# Patient Record
Sex: Female | Born: 1965 | Marital: Married | Smoking: Never smoker
Health system: Southern US, Community
[De-identification: ages and names within clinical notes are randomized; demographics above are authoritative.]

## PROBLEM LIST (undated history)

## (undated) DIAGNOSIS — C419 Malignant neoplasm of bone and articular cartilage, unspecified: Secondary | ICD-10-CM

## (undated) DIAGNOSIS — R001 Bradycardia, unspecified: Secondary | ICD-10-CM

## (undated) DIAGNOSIS — R569 Unspecified convulsions: Secondary | ICD-10-CM

## (undated) HISTORY — PX: PORTACATH PLACEMENT: SHX2246

## (undated) HISTORY — DX: Bradycardia, unspecified: R00.1

## (undated) HISTORY — PX: OTHER SURGICAL HISTORY: SHX169

## (undated) HISTORY — PX: LUNG CANCER SURGERY: SHX702

## (undated) HISTORY — DX: Unspecified convulsions: R56.9

## (undated) HISTORY — DX: Malignant neoplasm of bone and articular cartilage, unspecified: C41.9

## (undated) HISTORY — PX: BRAIN SURGERY: SHX531

---

## 2003-06-15 ENCOUNTER — Other Ambulatory Visit: Payer: Self-pay

## 2004-01-12 ENCOUNTER — Ambulatory Visit: Payer: Self-pay

## 2004-12-25 ENCOUNTER — Ambulatory Visit: Payer: Self-pay | Admitting: Unknown Physician Specialty

## 2006-02-05 ENCOUNTER — Ambulatory Visit: Payer: Self-pay

## 2007-06-25 ENCOUNTER — Ambulatory Visit: Payer: Self-pay

## 2008-04-08 HISTORY — PX: GALLBLADDER SURGERY: SHX652

## 2008-06-28 ENCOUNTER — Ambulatory Visit: Payer: Self-pay

## 2009-01-30 ENCOUNTER — Ambulatory Visit: Payer: Self-pay | Admitting: Family Medicine

## 2009-02-02 ENCOUNTER — Ambulatory Visit: Payer: Self-pay | Admitting: Surgery

## 2009-02-06 ENCOUNTER — Ambulatory Visit: Payer: Self-pay | Admitting: Cardiothoracic Surgery

## 2009-02-14 ENCOUNTER — Ambulatory Visit: Payer: Self-pay | Admitting: Surgery

## 2009-02-20 ENCOUNTER — Ambulatory Visit: Payer: Self-pay | Admitting: Specialist

## 2009-02-22 ENCOUNTER — Ambulatory Visit: Payer: Self-pay | Admitting: Cardiothoracic Surgery

## 2009-02-23 ENCOUNTER — Ambulatory Visit: Payer: Self-pay | Admitting: Specialist

## 2009-03-03 ENCOUNTER — Ambulatory Visit: Payer: Self-pay | Admitting: General Surgery

## 2009-03-08 ENCOUNTER — Inpatient Hospital Stay: Payer: Self-pay | Admitting: General Surgery

## 2009-03-08 ENCOUNTER — Ambulatory Visit: Payer: Self-pay | Admitting: Cardiothoracic Surgery

## 2009-03-17 ENCOUNTER — Ambulatory Visit: Payer: Self-pay | Admitting: General Surgery

## 2009-03-21 ENCOUNTER — Ambulatory Visit: Payer: Self-pay | Admitting: General Surgery

## 2009-03-22 ENCOUNTER — Ambulatory Visit: Payer: Self-pay | Admitting: Internal Medicine

## 2009-04-08 ENCOUNTER — Ambulatory Visit: Payer: Self-pay | Admitting: Cardiothoracic Surgery

## 2009-04-10 ENCOUNTER — Ambulatory Visit: Payer: Self-pay | Admitting: Internal Medicine

## 2009-04-10 ENCOUNTER — Ambulatory Visit: Payer: Self-pay | Admitting: General Surgery

## 2009-04-11 ENCOUNTER — Ambulatory Visit: Payer: Self-pay | Admitting: General Surgery

## 2009-05-02 ENCOUNTER — Ambulatory Visit: Payer: Self-pay | Admitting: General Surgery

## 2009-05-09 ENCOUNTER — Ambulatory Visit: Payer: Self-pay | Admitting: Cardiothoracic Surgery

## 2009-05-09 ENCOUNTER — Ambulatory Visit: Payer: Self-pay | Admitting: Internal Medicine

## 2009-06-06 ENCOUNTER — Ambulatory Visit: Payer: Self-pay | Admitting: Cardiothoracic Surgery

## 2009-06-06 ENCOUNTER — Ambulatory Visit: Payer: Self-pay | Admitting: Internal Medicine

## 2009-07-04 ENCOUNTER — Ambulatory Visit: Payer: Self-pay | Admitting: Internal Medicine

## 2009-07-07 ENCOUNTER — Ambulatory Visit: Payer: Self-pay | Admitting: Cardiothoracic Surgery

## 2009-07-07 ENCOUNTER — Ambulatory Visit: Payer: Self-pay | Admitting: Internal Medicine

## 2009-08-06 ENCOUNTER — Ambulatory Visit: Payer: Self-pay | Admitting: Internal Medicine

## 2009-08-06 ENCOUNTER — Ambulatory Visit: Payer: Self-pay | Admitting: Cardiothoracic Surgery

## 2009-09-06 ENCOUNTER — Ambulatory Visit: Payer: Self-pay | Admitting: Cardiothoracic Surgery

## 2009-09-06 ENCOUNTER — Ambulatory Visit: Payer: Self-pay | Admitting: Internal Medicine

## 2009-10-03 ENCOUNTER — Ambulatory Visit: Payer: Self-pay | Admitting: General Surgery

## 2009-10-06 ENCOUNTER — Ambulatory Visit: Payer: Self-pay | Admitting: Internal Medicine

## 2009-10-06 ENCOUNTER — Ambulatory Visit: Payer: Self-pay | Admitting: Cardiothoracic Surgery

## 2009-11-06 ENCOUNTER — Ambulatory Visit: Payer: Self-pay | Admitting: Cardiothoracic Surgery

## 2009-11-06 ENCOUNTER — Ambulatory Visit: Payer: Self-pay | Admitting: Internal Medicine

## 2009-12-05 ENCOUNTER — Emergency Department: Payer: Self-pay | Admitting: Emergency Medicine

## 2009-12-07 ENCOUNTER — Ambulatory Visit: Payer: Self-pay | Admitting: Cardiothoracic Surgery

## 2009-12-07 ENCOUNTER — Ambulatory Visit: Payer: Self-pay | Admitting: Internal Medicine

## 2010-01-22 ENCOUNTER — Ambulatory Visit: Payer: Self-pay | Admitting: Internal Medicine

## 2010-02-06 ENCOUNTER — Ambulatory Visit: Payer: Self-pay | Admitting: Internal Medicine

## 2010-03-08 ENCOUNTER — Ambulatory Visit: Payer: Self-pay | Admitting: Internal Medicine

## 2010-04-08 ENCOUNTER — Ambulatory Visit: Payer: Self-pay | Admitting: Internal Medicine

## 2010-04-20 ENCOUNTER — Observation Stay: Payer: Self-pay | Admitting: Internal Medicine

## 2010-05-09 ENCOUNTER — Ambulatory Visit: Payer: Self-pay | Admitting: Internal Medicine

## 2010-06-07 ENCOUNTER — Ambulatory Visit: Payer: Self-pay | Admitting: Internal Medicine

## 2010-07-10 ENCOUNTER — Ambulatory Visit: Payer: Self-pay | Admitting: Internal Medicine

## 2010-08-07 ENCOUNTER — Ambulatory Visit: Payer: Self-pay | Admitting: Internal Medicine

## 2010-10-03 ENCOUNTER — Ambulatory Visit: Payer: Self-pay | Admitting: Internal Medicine

## 2010-10-07 ENCOUNTER — Ambulatory Visit: Payer: Self-pay | Admitting: Internal Medicine

## 2010-10-11 ENCOUNTER — Emergency Department: Payer: Self-pay | Admitting: Emergency Medicine

## 2010-11-05 ENCOUNTER — Inpatient Hospital Stay: Payer: Self-pay | Admitting: Internal Medicine

## 2010-11-07 ENCOUNTER — Ambulatory Visit: Payer: Self-pay | Admitting: Internal Medicine

## 2010-12-06 ENCOUNTER — Inpatient Hospital Stay: Payer: Self-pay | Admitting: Internal Medicine

## 2010-12-08 ENCOUNTER — Ambulatory Visit: Payer: Self-pay | Admitting: Internal Medicine

## 2010-12-11 ENCOUNTER — Emergency Department: Payer: Self-pay | Admitting: Emergency Medicine

## 2011-01-07 ENCOUNTER — Ambulatory Visit: Payer: Self-pay | Admitting: Internal Medicine

## 2011-01-17 ENCOUNTER — Inpatient Hospital Stay: Payer: Self-pay | Admitting: Internal Medicine

## 2011-02-07 ENCOUNTER — Ambulatory Visit: Payer: Self-pay | Admitting: Internal Medicine

## 2011-03-09 ENCOUNTER — Ambulatory Visit: Payer: Self-pay | Admitting: Internal Medicine

## 2011-04-09 ENCOUNTER — Ambulatory Visit: Payer: Self-pay | Admitting: Internal Medicine

## 2011-04-10 LAB — CBC CANCER CENTER
Basophil %: 0 %
Eosinophil %: 0.8 %
HCT: 29.8 % — ABNORMAL LOW (ref 35.0–47.0)
HGB: 10 g/dL — ABNORMAL LOW (ref 12.0–16.0)
Lymphocyte #: 0.4 x10 3/mm — ABNORMAL LOW (ref 1.0–3.6)
MCH: 31.3 pg (ref 26.0–34.0)
MCHC: 33.6 g/dL (ref 32.0–36.0)
MCV: 93 fL (ref 80–100)
Monocyte #: 0.1 x10 3/mm (ref 0.0–0.7)
Monocyte %: 4.5 %
Neutrophil #: 1.6 x10 3/mm (ref 1.4–6.5)
Neutrophil %: 75.1 %
RBC: 3.19 10*6/uL — ABNORMAL LOW (ref 3.80–5.20)

## 2011-04-17 LAB — CBC CANCER CENTER
Basophil %: 0.2 %
Lymphocyte #: 0.7 x10 3/mm — ABNORMAL LOW (ref 1.0–3.6)
Lymphocyte %: 19.5 %
MCH: 31.5 pg (ref 26.0–34.0)
MCHC: 33.9 g/dL (ref 32.0–36.0)
Monocyte %: 3 %
Neutrophil #: 2.8 x10 3/mm (ref 1.4–6.5)
RDW: 18.5 % — ABNORMAL HIGH (ref 11.5–14.5)

## 2011-04-17 LAB — HEPATIC FUNCTION PANEL A (ARMC)
Albumin: 3.6 g/dL (ref 3.4–5.0)
Bilirubin, Direct: 0.2 mg/dL (ref 0.00–0.20)
SGPT (ALT): 31 U/L

## 2011-04-17 LAB — BASIC METABOLIC PANEL
BUN: 10 mg/dL (ref 7–18)
Chloride: 101 mmol/L (ref 98–107)
Co2: 30 mmol/L (ref 21–32)
Creatinine: 0.86 mg/dL (ref 0.60–1.30)
EGFR (African American): >60
Sodium: 138 mmol/L (ref 136–145)

## 2011-04-24 LAB — CBC CANCER CENTER
Basophil #: 0 x10 3/mm (ref 0.0–0.1)
Eosinophil #: 0 x10 3/mm (ref 0.0–0.7)
HCT: 37.2 % (ref 35.0–47.0)
Lymphocyte #: 0.8 x10 3/mm — ABNORMAL LOW (ref 1.0–3.6)
Lymphocyte %: 25.4 %
MCHC: 33.6 g/dL (ref 32.0–36.0)
Monocyte #: 0.2 x10 3/mm (ref 0.0–0.7)
Neutrophil #: 2 x10 3/mm (ref 1.4–6.5)
Platelet: 32 x10 3/mm — ABNORMAL LOW (ref 150–440)
RDW: 21 % — ABNORMAL HIGH (ref 11.5–14.5)

## 2011-05-02 LAB — CBC CANCER CENTER
Eosinophil #: 0 x10 3/mm (ref 0.0–0.7)
Eosinophil %: 0.6 %
HCT: 36.4 % (ref 35.0–47.0)
Lymphocyte #: 0.8 x10 3/mm — ABNORMAL LOW (ref 1.0–3.6)
MCH: 33.4 pg (ref 26.0–34.0)
MCHC: 33.9 g/dL (ref 32.0–36.0)
MCV: 98 fL (ref 80–100)
Monocyte #: 0.1 x10 3/mm (ref 0.0–0.7)
Neutrophil %: 60.5 %
Platelet: 24 x10 3/mm — CL (ref 150–440)
RBC: 3.7 10*6/uL — ABNORMAL LOW (ref 3.80–5.20)
WBC: 2.6 x10 3/mm — ABNORMAL LOW (ref 3.6–11.0)

## 2011-05-09 ENCOUNTER — Encounter: Payer: Self-pay | Admitting: Internal Medicine

## 2011-05-09 DIAGNOSIS — R9431 Abnormal electrocardiogram [ECG] [EKG]: Secondary | ICD-10-CM

## 2011-05-09 LAB — COMPREHENSIVE METABOLIC PANEL
Anion Gap: 8 (ref 7–16)
Bilirubin,Total: 0.9 mg/dL (ref 0.2–1.0)
Chloride: 102 mmol/L (ref 98–107)
EGFR (African American): >60
Glucose: 105 mg/dL — ABNORMAL HIGH (ref 65–99)
Osmolality: 278 (ref 275–301)
Potassium: 3.6 mmol/L (ref 3.5–5.1)
SGOT(AST): 53 U/L — ABNORMAL HIGH (ref 15–37)
Sodium: 140 mmol/L (ref 136–145)
Total Protein: 7.4 g/dL (ref 6.4–8.2)

## 2011-05-09 LAB — CBC CANCER CENTER
Basophil #: 0 x10 3/mm (ref 0.0–0.1)
Eosinophil #: 0 x10 3/mm (ref 0.0–0.7)
Eosinophil %: 1.4 %
HCT: 35.4 % (ref 35.0–47.0)
Lymphocyte #: 0.5 x10 3/mm — ABNORMAL LOW (ref 1.0–3.6)
Lymphocyte %: 25.2 %
MCH: 34.1 pg — ABNORMAL HIGH (ref 26.0–34.0)
MCHC: 34.3 g/dL (ref 32.0–36.0)
MCV: 99 fL (ref 80–100)
Monocyte %: 6 %
Neutrophil %: 67.3 %
Platelet: 26 x10 3/mm — CL (ref 150–440)
RBC: 3.56 10*6/uL — ABNORMAL LOW (ref 3.80–5.20)
RDW: 23.4 % — ABNORMAL HIGH (ref 11.5–14.5)

## 2011-05-09 LAB — MAGNESIUM: Magnesium: 1.9 mg/dL

## 2011-05-09 LAB — PROTIME-INR: Prothrombin Time: 13.1 secs (ref 11.5–14.7)

## 2011-05-10 ENCOUNTER — Encounter: Payer: Self-pay | Admitting: Internal Medicine

## 2011-05-10 ENCOUNTER — Ambulatory Visit: Payer: Self-pay | Admitting: Internal Medicine

## 2011-05-10 ENCOUNTER — Ambulatory Visit (INDEPENDENT_AMBULATORY_CARE_PROVIDER_SITE_OTHER): Payer: BC Managed Care – PPO | Admitting: Internal Medicine

## 2011-05-10 VITALS — BP 151/85 | HR 85 | Ht 61.0 in | Wt 128.0 lb

## 2011-05-10 DIAGNOSIS — R9431 Abnormal electrocardiogram [ECG] [EKG]: Secondary | ICD-10-CM

## 2011-05-10 DIAGNOSIS — I498 Other specified cardiac arrhythmias: Secondary | ICD-10-CM

## 2011-05-10 DIAGNOSIS — R001 Bradycardia, unspecified: Secondary | ICD-10-CM

## 2011-05-10 NOTE — Progress Notes (Signed)
History and Physical  Patient ID: [name] MRN: [id number], SOB: 1965/09/05 46 y.o. Date of Encounter: 05/10/2011, 9:12 AM  Primary Physician: No primary provider on file. Primary Cardiologist:  Primary Electrophysiologist:  Graciela Husbands  Chief Complaint: referred for bradycardia History of Present Illness: Ashley Flowers is a 46 y.o. 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 We unfortunately don't have that tracing. She was having no associated symptoms with this.  Concurrent with this medication has also been episodic nausea and vomiting which was the one occasion with protracted and associated with diarrhea dehydration    She continues to have progressively metastatic disease. Evaluation of her heart in the past included extensive testing in [place] the fall 2011 which is normal.   Past Medical History  Diagnosis Date  . Ewing sarcoma     Pnet sarcoma  . Seizures      Past Surgical History  Procedure Date  . Gallbladder surgery 2010  . Lung cancer surgery   . Brain surgery     brain tumor  . Kidney stones   . Portacath placement     left side      Current Outpatient Prescriptions  Medication Sig Dispense Refill  . ALPRAZolam (XANAX) 0.5 MG tablet Take 0.5 mg by mouth at bedtime as needed.      . lansoprazole (PREVACID) 15 MG capsule Take 15 mg by mouth daily.      Marland Kitchen levETIRAcetam (KEPPRA) 500 MG tablet Take 1,000 mg by mouth every 12 (twelve) hours.      Marland Kitchen oxycodone (OXY-IR) 5 MG capsule Take 5 mg by mouth every 4 (four) hours as needed.      . promethazine (PHENERGAN) 25 MG tablet Take 25 mg by mouth every 6 (six) hours as needed.      . zolpidem (AMBIEN) 10 MG tablet Take 10 mg  by mouth at bedtime as needed.         Allergies: Allergies  Allergen Reactions  . Erythromycin   . Flagyl (Metronidazole Hcl)     Could cause seizures  . Sulfa Antibiotics   . Vancomycin   . Zofran      History  Substance Use Topics  . Smoking status: Never Smoker   . Smokeless tobacco: Not on file  . Alcohol Use: No      History reviewed. No pertinent family history.    ROS:  Please see the history of present illness.     All other systems reviewed and negative.   Vital Signs: Blood pressure 151/85, pulse 85, height 5\' 1"  (1.549 m), weight 128 lb (58.06 kg).  PHYSICAL EXAM: General:  Well nourished, il appearing caucasian female in no acute distress but pale and upset  HEENT: normal Lymph: no adenopathy Neck: no JVD Endocrine:  No thryomegaly Vascular: No carotid bruits; FA pulses 2+ bilaterally without bruits Cardiac:  normal S1, S2; RRR; 2/6 murmur Back: without kyphosis/scoliosis, no CVA tenderness Lungs:  clear to auscultation bilaterally,decreased breath sounds rightAbd: soft, nontender, no hepatomegaly Ext: no edema Musculoskeletal:  No deformities, BUE and BLE strength normal and equal Skin: warm and dry Neuro:  CNs 2-12 intact, no focal abnormalities noted Psych:  Normal affect   EKG:  nsr 86 .  12/.08/.38 O/w normal  Labs:   No results found for this basename: WBC, HGB, HCT, MCV, PLT   No results found for this basename: NA,K,CL,CO2,BUN,CREATININE,CALCIUM,LABALBU,PROT,BILITOT,ALKPHOS,ALT,AST,GLUCOSE in the last 168 hours No results found for this basename: CKTOTAL:4,CKMB:4,TROPONINI:4 in the last 72 hours No results found for this basename: CHOL, HDL, LDLCALC, TRIG   No results found for this basename: DDIMER   BNP No results found for this basename: probnp       ASSESSMENT AND PLAN:

## 2011-05-10 NOTE — Assessment & Plan Note (Addendum)
The patient has reportedly junctional rhythm and bradycardia on ECG yesterday. UCG today is normal. Comparative feel echocardiogram in December it is unchanged except for the heart rate is about 15 beats per minutes slower.  The issue on the table is whether the VOTRIENT is responsible for the bradycardia and whether the bradycardia is responsible for nausea and vomiting. I think the likely answer to the lateral question is no. The answer to the former question is probably yes. I have found case reports of bradycardia associated with this drug.  It's palliative value in this patient seems to be high. In addition she expressed great sadness and the potential that she would not be able to take the medication as it would then portend nothing left to do and her impending dying. In the event that it is felt important to try to continue the VOTRIENT I would suggest that we used a MCOT monitor which would allow [place]  real-time telemetry. Initially I would hold the medication for a week and get a baseline and then resume the medication and if bradycardia that was symptomatic developed in the medication could be stopped. In the event that the medication needed to be continued, and MRI compatible pacemaker might be an option. I will discuss these issues with Dr.  Carmelina Noun   LATE addition   The strips finally arrived this afternoon(or so) and showed  A >3-4 sec pause.  This is much more concering than i had inferred from the label of her problem  She is being monitored by the [facility] and the plan is to keep her off the VOTRIENT for a week before resuming so e will be able to define a baseline as well as identify any further pauses

## 2011-05-15 LAB — CBC CANCER CENTER
Basophil #: 0 x10 3/mm (ref 0.0–0.1)
Basophil %: 0.1 %
Eosinophil #: 0.1 x10 3/mm (ref 0.0–0.7)
Eosinophil %: 1.4 %
HCT: 36.3 % (ref 35.0–47.0)
HGB: 12.4 g/dL (ref 12.0–16.0)
Lymphocyte #: 0.7 x10 3/mm — ABNORMAL LOW (ref 1.0–3.6)
MCV: 100 fL (ref 80–100)
Monocyte #: 0.3 x10 3/mm (ref 0.0–0.7)
Monocyte %: 9.1 %
Neutrophil #: 2.7 x10 3/mm (ref 1.4–6.5)
Neutrophil %: 70 %
Platelet: 39 x10 3/mm — ABNORMAL LOW (ref 150–440)
RDW: 23.5 % — ABNORMAL HIGH (ref 11.5–14.5)
WBC: 3.8 x10 3/mm (ref 3.6–11.0)

## 2011-05-15 LAB — BASIC METABOLIC PANEL
Anion Gap: 7 (ref 7–16)
Calcium, Total: 9 mg/dL (ref 8.5–10.1)
Chloride: 103 mmol/L (ref 98–107)
Co2: 31 mmol/L (ref 21–32)
Creatinine: 0.76 mg/dL (ref 0.60–1.30)
EGFR (African American): >60
Sodium: 141 mmol/L (ref 136–145)

## 2011-05-22 LAB — CBC CANCER CENTER
Basophil %: 0.4 %
Eosinophil #: 0 x10 3/mm (ref 0.0–0.7)
Eosinophil %: 1.4 %
HCT: 34.5 % — ABNORMAL LOW (ref 35.0–47.0)
HGB: 11.6 g/dL — ABNORMAL LOW (ref 12.0–16.0)
Lymphocyte #: 0.6 x10 3/mm — ABNORMAL LOW (ref 1.0–3.6)
Lymphocyte %: 27.2 %
MCH: 33.9 pg (ref 26.0–34.0)
MCV: 101 fL — ABNORMAL HIGH (ref 80–100)
Monocyte %: 11.7 %
Neutrophil #: 1.3 x10 3/mm — ABNORMAL LOW (ref 1.4–6.5)
RBC: 3.43 10*6/uL — ABNORMAL LOW (ref 3.80–5.20)
WBC: 2.2 x10 3/mm — ABNORMAL LOW (ref 3.6–11.0)

## 2011-05-22 LAB — BASIC METABOLIC PANEL
Anion Gap: 8 (ref 7–16)
BUN: 13 mg/dL (ref 7–18)
Creatinine: 0.8 mg/dL (ref 0.60–1.30)
EGFR (African American): >60
EGFR (Non-African Amer.): >60
Glucose: 95 mg/dL (ref 65–99)
Osmolality: 279 (ref 275–301)

## 2011-05-22 LAB — MAGNESIUM: Magnesium: 2 mg/dL

## 2011-05-29 LAB — CBC CANCER CENTER
Basophil %: 0.2 %
Eosinophil #: 0 x10 3/mm (ref 0.0–0.7)
Eosinophil %: 1.1 %
HGB: 12.3 g/dL (ref 12.0–16.0)
Lymphocyte %: 27.4 %
MCH: 34.5 pg — ABNORMAL HIGH (ref 26.0–34.0)
Monocyte #: 0.2 x10 3/mm (ref 0.0–0.7)
Monocyte %: 6 %
Neutrophil %: 65.3 %
Platelet: 40 x10 3/mm — ABNORMAL LOW (ref 150–440)
RBC: 3.58 10*6/uL — ABNORMAL LOW (ref 3.80–5.20)
RDW: 21.7 % — ABNORMAL HIGH (ref 11.5–14.5)
WBC: 2.8 x10 3/mm — ABNORMAL LOW (ref 3.6–11.0)

## 2011-05-29 LAB — BASIC METABOLIC PANEL
BUN: 13 mg/dL (ref 7–18)
EGFR (African American): >60
EGFR (Non-African Amer.): >60
Glucose: 105 mg/dL — ABNORMAL HIGH (ref 65–99)
Osmolality: 282 (ref 275–301)
Potassium: 3.6 mmol/L (ref 3.5–5.1)

## 2011-06-05 LAB — CBC CANCER CENTER
Basophil %: 0 %
Eosinophil #: 0 x10 3/mm (ref 0.0–0.7)
HCT: 35.7 % (ref 35.0–47.0)
HGB: 12.3 g/dL (ref 12.0–16.0)
Lymphocyte %: 18.7 %
MCH: 34.6 pg — ABNORMAL HIGH (ref 26.0–34.0)
MCHC: 34.3 g/dL (ref 32.0–36.0)
MCV: 101 fL — ABNORMAL HIGH (ref 80–100)
Monocyte #: 0.2 x10 3/mm (ref 0.0–0.7)
Neutrophil %: 70.4 %
Platelet: 26 x10 3/mm — CL (ref 150–440)
RBC: 3.54 10*6/uL — ABNORMAL LOW (ref 3.80–5.20)

## 2011-06-06 ENCOUNTER — Telehealth: Payer: Self-pay | Admitting: Physician Assistant

## 2011-06-06 NOTE — Telephone Encounter (Signed)
Was called regarding abnormal rhythm recorded on patient's MCOT. Representative states the strip is of poor quality, but resembles atrial fibrillation. Rate 90-100 bpm. No pauses noted. The strip was faxed to the [facility] and [employer]. Upon calling patient, she states that she does not feel out of the ordinary. She reports that she has had been diagnosed with a "cold" and was put on Levaquin recently. She is currently on a trial off Vortient TKI to evaluate for bradycardic changes while on it. Upon speaking with the patient, and explaining the possibility of atrial fibrillation and complications of it, patient will present to the ED for a formal EKG.    Jacqulyn Bath, PA-C 06/06/2011 10:26 PM

## 2011-06-07 ENCOUNTER — Ambulatory Visit: Payer: Self-pay | Admitting: Internal Medicine

## 2011-06-07 ENCOUNTER — Observation Stay: Payer: Self-pay | Admitting: Internal Medicine

## 2011-06-07 DIAGNOSIS — I499 Cardiac arrhythmia, unspecified: Secondary | ICD-10-CM

## 2011-06-07 LAB — COMPREHENSIVE METABOLIC PANEL
Alkaline Phosphatase: 120 U/L (ref 50–136)
Anion Gap: 8 (ref 7–16)
BUN: 9 mg/dL (ref 7–18)
Calcium, Total: 8.8 mg/dL (ref 8.5–10.1)
Chloride: 101 mmol/L (ref 98–107)
Co2: 28 mmol/L (ref 21–32)
EGFR (African American): >60
EGFR (Non-African Amer.): >60
Glucose: 98 mg/dL (ref 65–99)
Osmolality: 272 (ref 275–301)
Sodium: 137 mmol/L (ref 136–145)
Total Protein: 6.8 g/dL (ref 6.4–8.2)

## 2011-06-07 LAB — DIFFERENTIAL
Basophil #: 0 10*3/uL (ref 0.0–0.1)
Eosinophil #: 0 10*3/uL (ref 0.0–0.7)
Eosinophil %: 0.2 %
Lymphocyte #: 0.4 10*3/uL — ABNORMAL LOW (ref 1.0–3.6)
Monocyte #: 0.2 10*3/uL (ref 0.0–0.7)
Neutrophil #: 1.7 10*3/uL (ref 1.4–6.5)

## 2011-06-07 LAB — CBC
HCT: 33.3 % — ABNORMAL LOW (ref 35.0–47.0)
HGB: 11.1 g/dL — ABNORMAL LOW (ref 12.0–16.0)
MCV: 102 fL — ABNORMAL HIGH (ref 80–100)
RBC: 3.27 10*6/uL — ABNORMAL LOW (ref 3.80–5.20)
RDW: 19.8 % — ABNORMAL HIGH (ref 11.5–14.5)
WBC: 2.3 10*3/uL — ABNORMAL LOW (ref 3.6–11.0)

## 2011-06-07 LAB — MAGNESIUM: Magnesium: 1.7 mg/dL — ABNORMAL LOW

## 2011-06-07 LAB — CK TOTAL AND CKMB (NOT AT ARMC): CK-MB: <0.5 ng/mL — ABNORMAL LOW (ref 0.5–3.6)

## 2011-06-07 LAB — URINALYSIS, COMPLETE
Bilirubin,UR: NEGATIVE
Glucose,UR: NEGATIVE mg/dL (ref 0–75)
Ph: 6 (ref 4.5–8.0)
RBC,UR: 32 /HPF (ref 0–5)
Specific Gravity: 1.02 (ref 1.003–1.030)
Squamous Epithelial: 1
WBC UR: 8 /HPF (ref 0–5)

## 2011-06-07 LAB — TROPONIN I: Troponin-I: <0.02 ng/mL

## 2011-06-12 LAB — CULTURE, BLOOD (SINGLE)

## 2011-06-13 LAB — CBC CANCER CENTER
Basophil %: 0.2 %
Eosinophil %: 1.6 %
Lymphocyte #: 0.4 x10 3/mm — ABNORMAL LOW (ref 1.0–3.6)
MCH: 35.2 pg — ABNORMAL HIGH (ref 26.0–34.0)
MCV: 104.7 fL — ABNORMAL HIGH (ref 80–100)
Monocyte #: 0.2 x10 3/mm (ref 0.0–0.7)
Platelet: 37 x10 3/mm — ABNORMAL LOW (ref 150–440)
RDW: 18.3 % — ABNORMAL HIGH (ref 11.5–14.5)

## 2011-06-14 ENCOUNTER — Other Ambulatory Visit: Payer: Self-pay

## 2011-06-14 DIAGNOSIS — R001 Bradycardia, unspecified: Secondary | ICD-10-CM

## 2011-06-19 LAB — BASIC METABOLIC PANEL
Calcium, Total: 8.8 mg/dL (ref 8.5–10.1)
Co2: 29 mmol/L (ref 21–32)
Creatinine: 0.75 mg/dL (ref 0.60–1.30)
EGFR (African American): >60
EGFR (Non-African Amer.): >60
Osmolality: 284 (ref 275–301)
Potassium: 3.6 mmol/L (ref 3.5–5.1)
Sodium: 143 mmol/L (ref 136–145)

## 2011-06-19 LAB — CBC CANCER CENTER
Basophil %: 0 %
Eosinophil %: 0.5 %
HCT: 33 % — ABNORMAL LOW (ref 35.0–47.0)
HGB: 11.1 g/dL — ABNORMAL LOW (ref 12.0–16.0)
Lymphocyte #: 0.3 x10 3/mm — ABNORMAL LOW (ref 1.0–3.6)
MCH: 34.4 pg — ABNORMAL HIGH (ref 26.0–34.0)
MCHC: 33.7 g/dL (ref 32.0–36.0)
MCV: 102 fL — ABNORMAL HIGH (ref 80–100)
Monocyte #: 0.1 x10 3/mm (ref 0.0–0.7)
Monocyte %: 6.2 %
Neutrophil #: 1.5 x10 3/mm (ref 1.4–6.5)
RBC: 3.23 10*6/uL — ABNORMAL LOW (ref 3.80–5.20)
WBC: 1.9 x10 3/mm — CL (ref 3.6–11.0)

## 2011-06-19 LAB — HEPATIC FUNCTION PANEL A (ARMC): SGOT(AST): 31 U/L (ref 15–37)

## 2011-06-20 LAB — URINALYSIS, COMPLETE
Bilirubin,UR: NEGATIVE
Glucose,UR: NEGATIVE mg/dL (ref 0–75)
Ketone: NEGATIVE
Leukocyte Esterase: NEGATIVE
RBC,UR: 741 /HPF (ref 0–5)
Squamous Epithelial: 1
WBC UR: 40 /HPF (ref 0–5)

## 2011-06-21 ENCOUNTER — Inpatient Hospital Stay: Payer: Self-pay | Admitting: Internal Medicine

## 2011-06-21 LAB — COMPREHENSIVE METABOLIC PANEL
Albumin: 3.7 g/dL (ref 3.4–5.0)
Alkaline Phosphatase: 117 U/L (ref 50–136)
Bilirubin,Total: 0.7 mg/dL (ref 0.2–1.0)
Calcium, Total: 9 mg/dL (ref 8.5–10.1)
Co2: 30 mmol/L (ref 21–32)
EGFR (Non-African Amer.): >60
Glucose: 82 mg/dL (ref 65–99)
Osmolality: 283 (ref 275–301)
Potassium: 4 mmol/L (ref 3.5–5.1)
SGOT(AST): 32 U/L (ref 15–37)
SGPT (ALT): 19 U/L

## 2011-06-21 LAB — CBC WITH DIFFERENTIAL/PLATELET
Basophil %: 0 %
Basophil %: 0.3 %
Comment - H1-Com1: NORMAL
Comment - H1-Com2: NORMAL
Eosinophil %: 0.7 %
Eosinophil: 2 %
HGB: 9.7 g/dL — ABNORMAL LOW (ref 12.0–16.0)
Lymphocyte #: 0.3 10*3/uL — ABNORMAL LOW (ref 1.0–3.6)
Lymphocyte #: 0.5 10*3/uL — ABNORMAL LOW (ref 1.0–3.6)
Lymphocyte %: 22.7 %
Lymphocytes: 24 %
MCH: 34.5 pg — ABNORMAL HIGH (ref 26.0–34.0)
MCH: 34.5 pg — ABNORMAL HIGH (ref 26.0–34.0)
MCHC: 33.4 g/dL (ref 32.0–36.0)
MCV: 103 fL — ABNORMAL HIGH (ref 80–100)
MCV: 104 fL — ABNORMAL HIGH (ref 80–100)
Monocyte %: 7.5 %
Monocyte %: 9 %
Neutrophil #: 1.6 10*3/uL (ref 1.4–6.5)
Neutrophil %: 74.9 %
Platelet: 23 10*3/uL — CL (ref 150–440)
Platelet: 47 10*3/uL — ABNORMAL LOW (ref 150–440)
RBC: 2.81 10*6/uL — ABNORMAL LOW (ref 3.80–5.20)
RBC: 3.05 10*6/uL — ABNORMAL LOW (ref 3.80–5.20)
Variant Lymphocyte - H1-Rlymph: 4 %
WBC: 2.3 10*3/uL — ABNORMAL LOW (ref 3.6–11.0)

## 2011-06-26 LAB — CBC CANCER CENTER
Basophil #: 0 x10 3/mm (ref 0.0–0.1)
Basophil %: 0.4 %
Eosinophil %: 0.8 %
HCT: 34.5 % — ABNORMAL LOW (ref 35.0–47.0)
HGB: 11.7 g/dL — ABNORMAL LOW (ref 12.0–16.0)
Lymphocyte #: 0.5 x10 3/mm — ABNORMAL LOW (ref 1.0–3.6)
MCH: 34.9 pg — ABNORMAL HIGH (ref 26.0–34.0)
MCV: 103 fL — ABNORMAL HIGH (ref 80–100)
Monocyte #: 0.1 x10 3/mm (ref 0.0–0.7)
Monocyte %: 5.1 %
Neutrophil #: 1.7 x10 3/mm (ref 1.4–6.5)
RDW: 17.7 % — ABNORMAL HIGH (ref 11.5–14.5)
WBC: 2.4 x10 3/mm — ABNORMAL LOW (ref 3.6–11.0)

## 2011-07-03 LAB — CBC CANCER CENTER
Basophil #: 0 x10 3/mm (ref 0.0–0.1)
Basophil %: 0.4 %
HCT: 38.8 % (ref 35.0–47.0)
HGB: 13.2 g/dL (ref 12.0–16.0)
MCH: 35.4 pg — ABNORMAL HIGH (ref 26.0–34.0)
MCHC: 34.1 g/dL (ref 32.0–36.0)
Monocyte #: 0.2 x10 3/mm (ref 0.0–0.7)
Neutrophil %: 65.3 %
Platelet: 30 x10 3/mm — CL (ref 150–440)
RBC: 3.72 10*6/uL — ABNORMAL LOW (ref 3.80–5.20)
RDW: 17.6 % — ABNORMAL HIGH (ref 11.5–14.5)
WBC: 2.6 x10 3/mm — ABNORMAL LOW (ref 3.6–11.0)

## 2011-07-08 ENCOUNTER — Ambulatory Visit: Payer: Self-pay | Admitting: Internal Medicine

## 2011-07-08 LAB — CBC CANCER CENTER
Eosinophil %: 0.4 %
HGB: 11.9 g/dL — ABNORMAL LOW (ref 12.0–16.0)
MCV: 105 fL — ABNORMAL HIGH (ref 80–100)
Neutrophil #: 2.5 x10 3/mm (ref 1.4–6.5)
Platelet: 38 x10 3/mm — ABNORMAL LOW (ref 150–440)
RBC: 3.28 10*6/uL — ABNORMAL LOW (ref 3.80–5.20)
RDW: 17.7 % — ABNORMAL HIGH (ref 11.5–14.5)

## 2011-07-08 LAB — BASIC METABOLIC PANEL
BUN: 13 mg/dL (ref 7–18)
Chloride: 100 mmol/L (ref 98–107)
Co2: 28 mmol/L (ref 21–32)
Creatinine: 0.8 mg/dL (ref 0.60–1.30)
EGFR (African American): >60
Glucose: 100 mg/dL — ABNORMAL HIGH (ref 65–99)
Osmolality: 278 (ref 275–301)
Potassium: 3.7 mmol/L (ref 3.5–5.1)

## 2011-07-08 LAB — URINALYSIS, COMPLETE
Glucose,UR: NEGATIVE mg/dL (ref 0–75)
Nitrite: NEGATIVE
Ph: 5 (ref 4.5–8.0)
RBC,UR: 1417 /HPF (ref 0–5)
Specific Gravity: 1.026 (ref 1.003–1.030)

## 2011-07-08 LAB — CANCER CTR PLATELET CT: Platelet: 61 x10 3/mm — ABNORMAL LOW (ref 150–440)

## 2011-07-08 LAB — HEPATIC FUNCTION PANEL A (ARMC)
SGPT (ALT): 28 U/L
Total Protein: 7.1 g/dL (ref 6.4–8.2)

## 2011-07-08 LAB — MAGNESIUM: Magnesium: 2 mg/dL

## 2011-07-09 LAB — URINE CULTURE

## 2011-07-17 LAB — CBC CANCER CENTER
Basophil %: 0.5 %
HCT: 34.3 % — ABNORMAL LOW (ref 35.0–47.0)
HGB: 11.7 g/dL — ABNORMAL LOW (ref 12.0–16.0)
Lymphocyte #: 0.6 x10 3/mm — ABNORMAL LOW (ref 1.0–3.6)
Lymphocyte %: 32.5 %
MCHC: 34.2 g/dL (ref 32.0–36.0)
Neutrophil #: 1 x10 3/mm — ABNORMAL LOW (ref 1.4–6.5)
Neutrophil %: 55.6 %
Platelet: 41 x10 3/mm — ABNORMAL LOW (ref 150–440)
RDW: 15.7 % — ABNORMAL HIGH (ref 11.5–14.5)
WBC: 1.8 x10 3/mm — CL (ref 3.6–11.0)

## 2011-07-24 LAB — CBC CANCER CENTER
Basophil %: 0.7 %
Eosinophil %: 1.7 %
HCT: 35.1 % (ref 35.0–47.0)
HGB: 11.5 g/dL — ABNORMAL LOW (ref 12.0–16.0)
Lymphocyte #: 0.6 x10 3/mm — ABNORMAL LOW (ref 1.0–3.6)
MCH: 35 pg — ABNORMAL HIGH (ref 26.0–34.0)
MCHC: 32.8 g/dL (ref 32.0–36.0)
MCV: 107 fL — ABNORMAL HIGH (ref 80–100)
Monocyte %: 5.5 %
Neutrophil #: 1.2 x10 3/mm — ABNORMAL LOW (ref 1.4–6.5)
Neutrophil %: 62.7 %
Platelet: 32 x10 3/mm — ABNORMAL LOW (ref 150–440)
WBC: 1.9 x10 3/mm — CL (ref 3.6–11.0)

## 2011-07-31 LAB — CBC CANCER CENTER
Basophil #: 0 x10 3/mm (ref 0.0–0.1)
Eosinophil #: 0 x10 3/mm (ref 0.0–0.7)
Eosinophil %: 1.2 %
HCT: 34.8 % — ABNORMAL LOW (ref 35.0–47.0)
HGB: 11.5 g/dL — ABNORMAL LOW (ref 12.0–16.0)
Lymphocyte %: 30.7 %
MCH: 35.3 pg — ABNORMAL HIGH (ref 26.0–34.0)
Monocyte %: 9 %
Neutrophil #: 1.2 x10 3/mm — ABNORMAL LOW (ref 1.4–6.5)
Neutrophil %: 58.8 %
Platelet: 34 x10 3/mm — ABNORMAL LOW (ref 150–440)
RDW: 16.7 % — ABNORMAL HIGH (ref 11.5–14.5)
WBC: 2 x10 3/mm — CL (ref 3.6–11.0)

## 2011-07-31 LAB — PHOSPHORUS: Phosphorus: 3.3 mg/dL (ref 2.5–4.9)

## 2011-07-31 LAB — MAGNESIUM: Magnesium: 1.7 mg/dL — ABNORMAL LOW

## 2011-07-31 LAB — BASIC METABOLIC PANEL
Anion Gap: 8 (ref 7–16)
Calcium, Total: 8.8 mg/dL (ref 8.5–10.1)
Co2: 29 mmol/L (ref 21–32)
Glucose: 108 mg/dL — ABNORMAL HIGH (ref 65–99)
Potassium: 3 mmol/L — ABNORMAL LOW (ref 3.5–5.1)
Sodium: 143 mmol/L (ref 136–145)

## 2011-07-31 LAB — HEPATIC FUNCTION PANEL A (ARMC)
Albumin: 3.8 g/dL (ref 3.4–5.0)
Alkaline Phosphatase: 118 U/L (ref 50–136)
SGOT(AST): 30 U/L (ref 15–37)
SGPT (ALT): 18 U/L

## 2011-08-06 LAB — CBC CANCER CENTER
Basophil #: 0 x10 3/mm (ref 0.0–0.1)
Basophil %: 0.4 %
Eosinophil #: 0 x10 3/mm (ref 0.0–0.7)
Eosinophil %: 2 %
HCT: 33.8 % — ABNORMAL LOW (ref 35.0–47.0)
Lymphocyte #: 0.6 x10 3/mm — ABNORMAL LOW (ref 1.0–3.6)
Lymphocyte %: 38.5 %
MCH: 35.4 pg — ABNORMAL HIGH (ref 26.0–34.0)
MCV: 108 fL — ABNORMAL HIGH (ref 80–100)
Monocyte %: 9.1 %
Neutrophil #: 0.8 x10 3/mm — ABNORMAL LOW (ref 1.4–6.5)
Neutrophil %: 50 %
Platelet: 30 x10 3/mm — CL (ref 150–440)
RBC: 3.12 10*6/uL — ABNORMAL LOW (ref 3.80–5.20)
WBC: 1.6 x10 3/mm — CL (ref 3.6–11.0)

## 2011-08-07 ENCOUNTER — Ambulatory Visit: Payer: Self-pay | Admitting: Internal Medicine

## 2011-08-07 LAB — PLATELET COUNT: Platelet: 49 10*3/uL — ABNORMAL LOW (ref 150–440)

## 2011-09-03 LAB — CBC CANCER CENTER
Basophil %: 0.2 %
Eosinophil %: 1 %
HGB: 10.5 g/dL — ABNORMAL LOW (ref 12.0–16.0)
Lymphocyte #: 0.6 x10 3/mm — ABNORMAL LOW (ref 1.0–3.6)
Lymphocyte %: 15.6 %
Neutrophil #: 2.7 x10 3/mm (ref 1.4–6.5)
Neutrophil %: 73.8 %
RDW: 16.5 % — ABNORMAL HIGH (ref 11.5–14.5)
WBC: 3.6 x10 3/mm (ref 3.6–11.0)

## 2011-09-07 ENCOUNTER — Ambulatory Visit: Payer: Self-pay | Admitting: Internal Medicine

## 2011-09-19 LAB — CBC CANCER CENTER
Basophil #: 0 x10 3/mm (ref 0.0–0.1)
Basophil %: 0.2 %
Eosinophil #: 0 x10 3/mm (ref 0.0–0.7)
Eosinophil %: 0.2 %
HCT: 25.7 % — ABNORMAL LOW (ref 35.0–47.0)
HGB: 8.1 g/dL — ABNORMAL LOW (ref 12.0–16.0)
Lymphocyte #: 0.3 x10 3/mm — ABNORMAL LOW (ref 1.0–3.6)
MCHC: 31.4 g/dL — ABNORMAL LOW (ref 32.0–36.0)
Monocyte %: 9.7 %
Neutrophil #: 4.6 x10 3/mm (ref 1.4–6.5)
RBC: 2.52 10*6/uL — ABNORMAL LOW (ref 3.80–5.20)
WBC: 5.5 x10 3/mm (ref 3.6–11.0)

## 2011-10-07 ENCOUNTER — Ambulatory Visit: Payer: Self-pay | Admitting: Internal Medicine

## 2011-10-07 DEATH — deceased

## 2012-07-07 IMAGING — CT CT HEAD WITHOUT AND WITH CONTRAST
1 of 2 series · 13 of 30 positions shown, 17 images · non-contrast
Comparison: none

REASON FOR EXAM: restaging metastatic PNET carcinoma  headache restaging
brain mets
COMMENTS:

PROCEDURE:     CT  - CT HEAD W/WO  - August 07, 2011  [DATE]
RESULT:     Comparison:  12/09/2010
INDICATION: PNET.
TECHNIQUE: Multiple axial images were obtained prior to following 70 mL of
Osovue-VCC IV contrast.

[Series 2: without · axial · non-contrast · 0.38mm/px · z∈[+216,+336]mm · 13 of 29 slices shown, 17 images]
[im 3/29  brain]
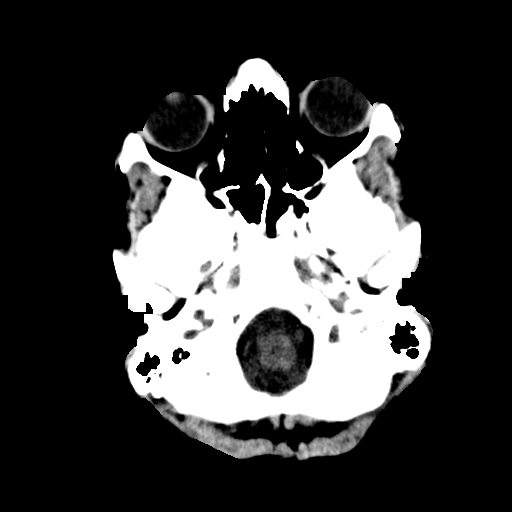
[im 3/29  bone]
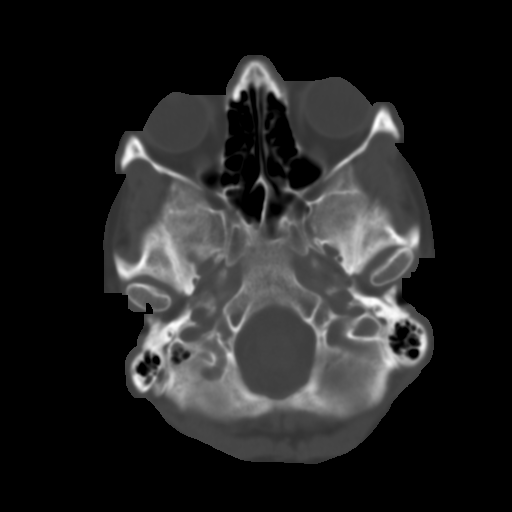
[im 5/29  brain]
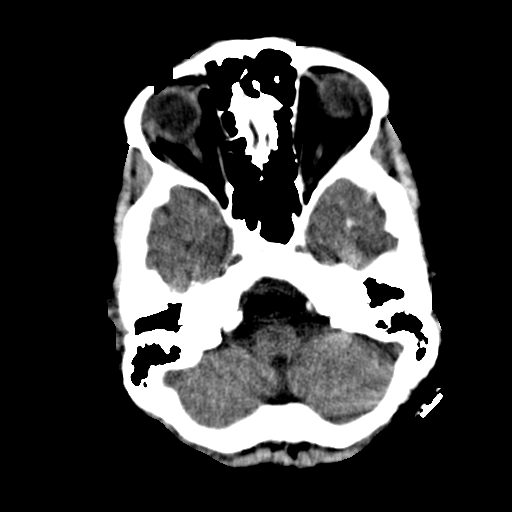
[im 7/29  brain]
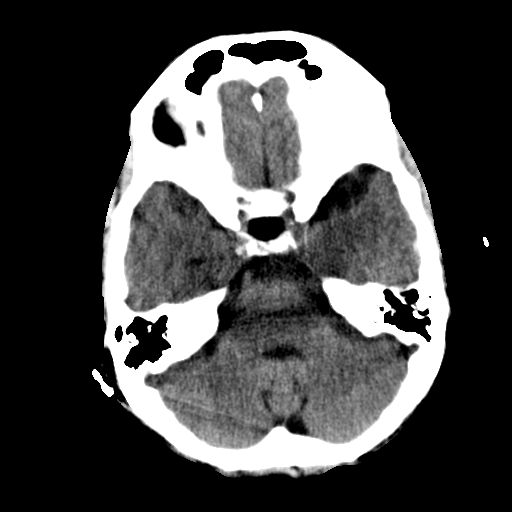
[im 9/29  brain]
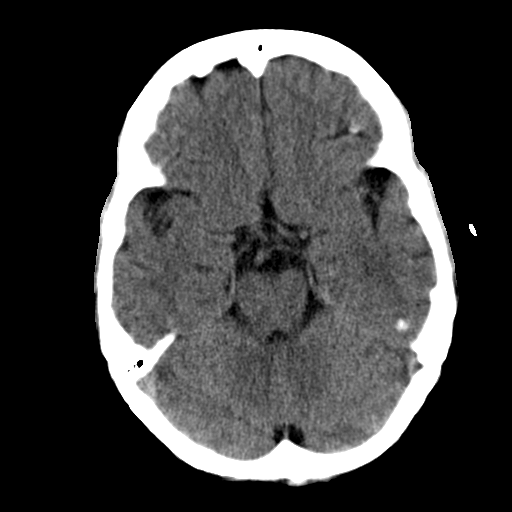
[im 11/29  brain]
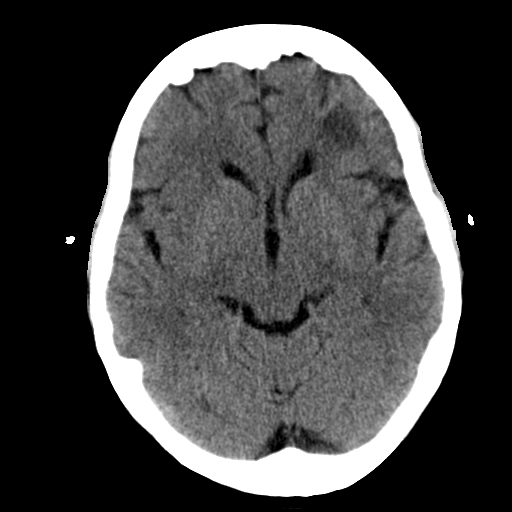
[im 11/29  bone]
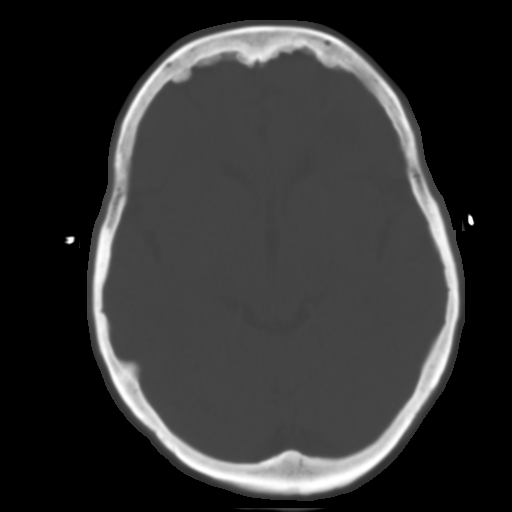
[im 13/29  brain]
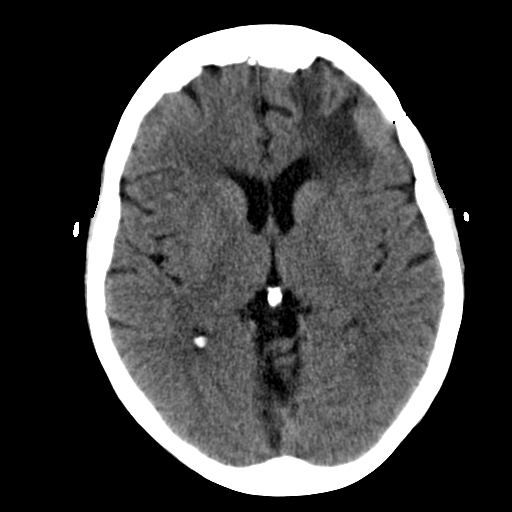
[im 15/29  brain]
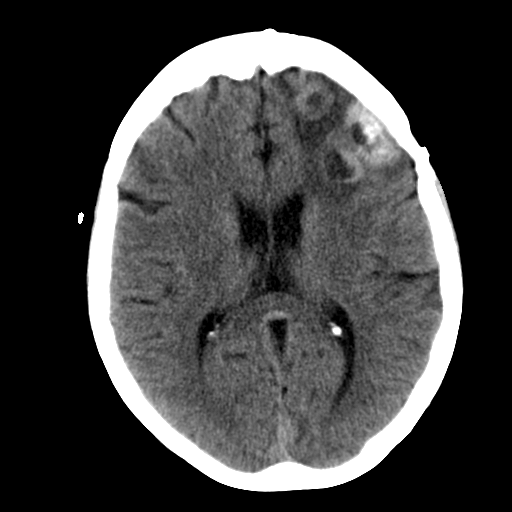
[im 17/29  brain]
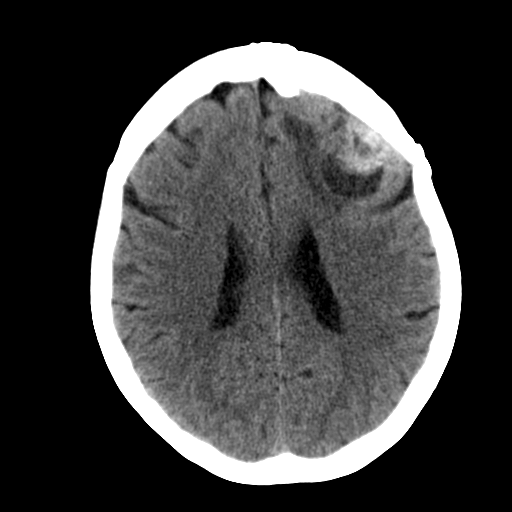
[im 19/29  brain]
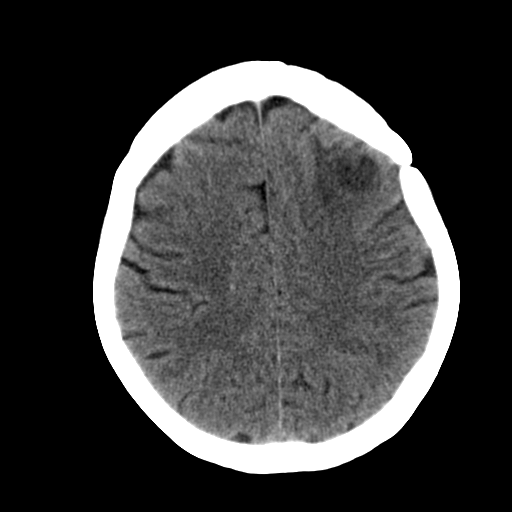
[im 19/29  bone]
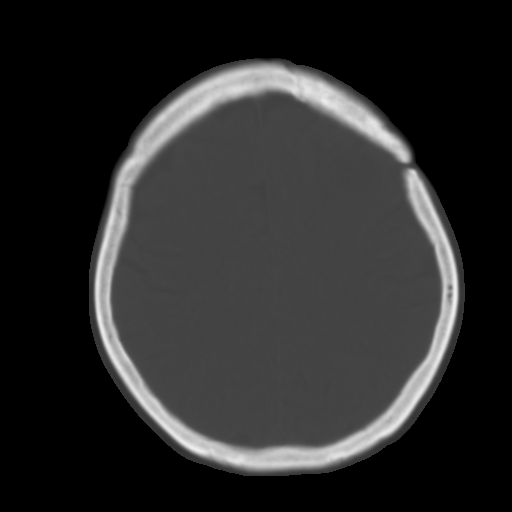
[im 21/29  brain]
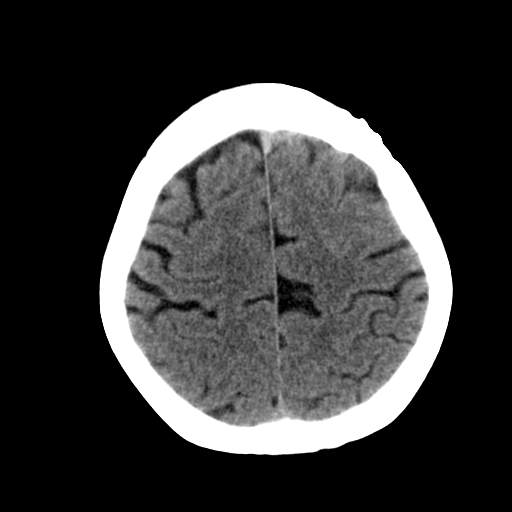
[im 23/29  brain]
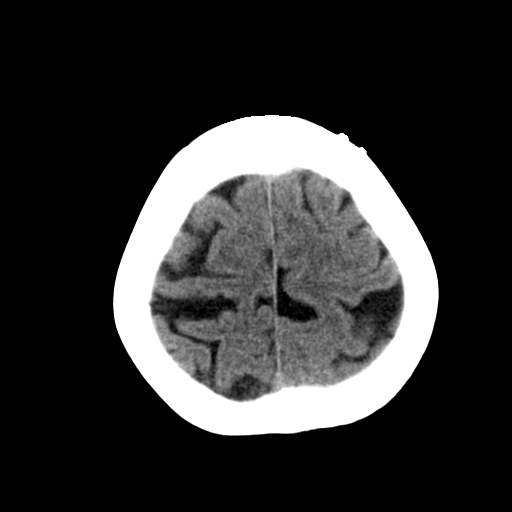
[im 25/29  brain]
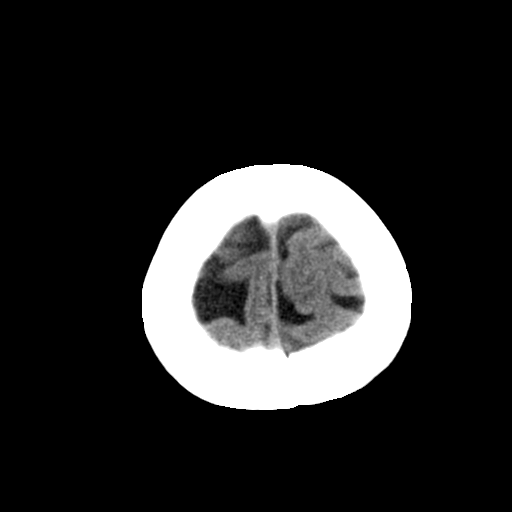
[im 27/29  brain]
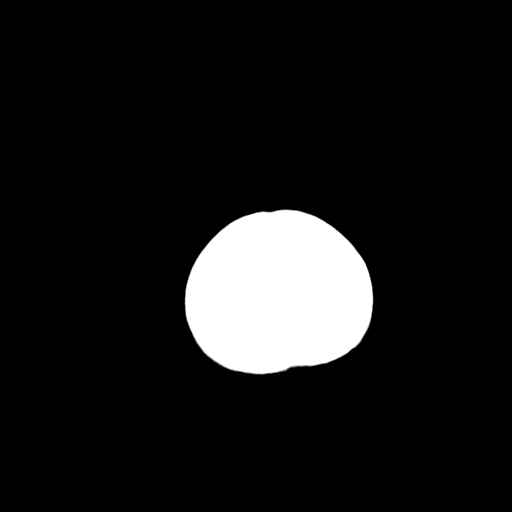
[im 27/29  bone]
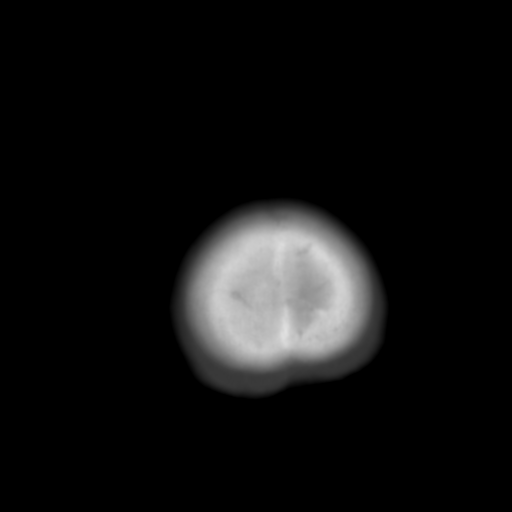

[13 of 30 positions shown; findings below may reference images not displayed]

FINDINGS: There is interval development of an enhancing 3.8 x 2.9 cm left frontal mass
with surrounding vasogenic edema. There is an adjacent peripherally
enhancing 1.9 cm left frontal lobe mass. The masses are at the site of prior
resection.

The basal cisterns are patent. There is no evidence for cortical-based area
of infarction.  Ventricles and sulci are appropriate for the patient's age.

Visualized portions of the orbits and paranasal sinuses are unremarkable.

There is evidence of a left frontal craniotomy.
IMPRESSION: 1. There has been interval development of 2 peripherally enhancing masses
with central cystic areas in the left frontal lobe with surrounding
vasogenic edema most consistent with recurrent PNET.

[REDACTED]

## 2012-07-07 IMAGING — CT CT CHEST W/ CM
1 of 2 series · 15 of 31 positions shown, 19 images · IV contrast (agent unspecified)
Comparison: 01/21/2011, 10/11/2010

REASON FOR EXAM: restaging metastatic PNET carcinoma  headache restaging
brain mets
COMMENTS:

PROCEDURE:     CT  - CT CHEST WITH CONTRAST  - August 07, 2011  [DATE]
RESULT:     Indication: Metastatic PNET
TECHNIQUE: Multiple axial images of the chest are obtained with 70 mL of
Xsovue-Z22 intravenous contrast.

[Series 2: soft tissue · axial · 0.58mm/px · z∈[-120,+111]mm · 15 of 85 slices shown, 19 images]
[im 4/85  mediastinal]
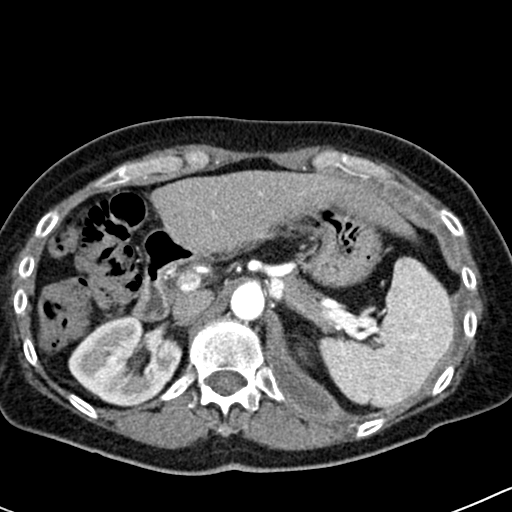
[im 4/85  lung]
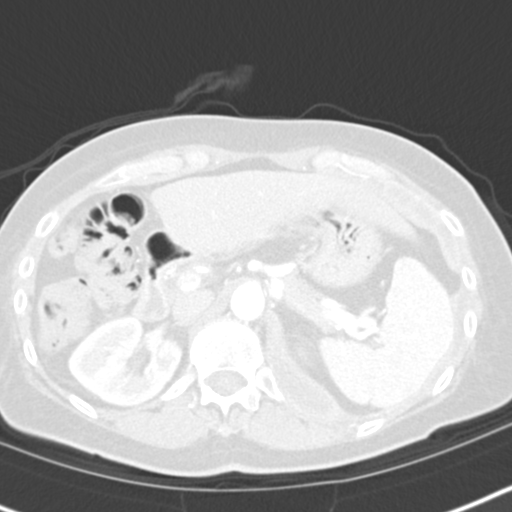
[im 11/85  lung]
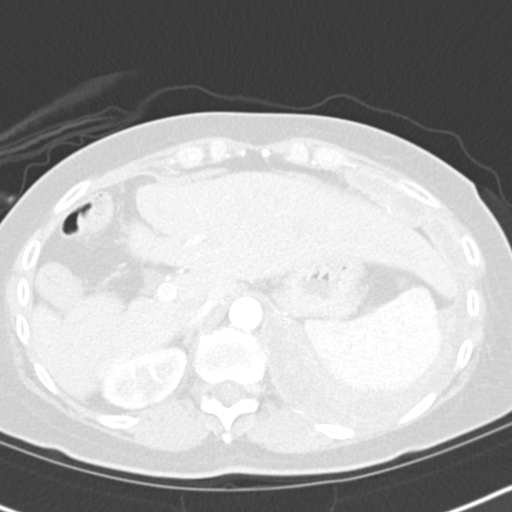
[im 19/85  lung]
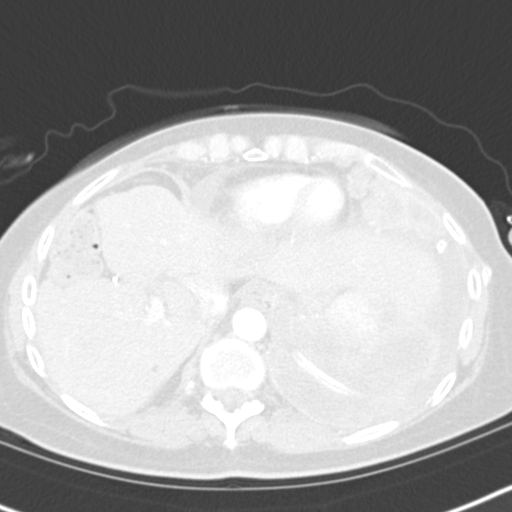
[im 22/85  lung]
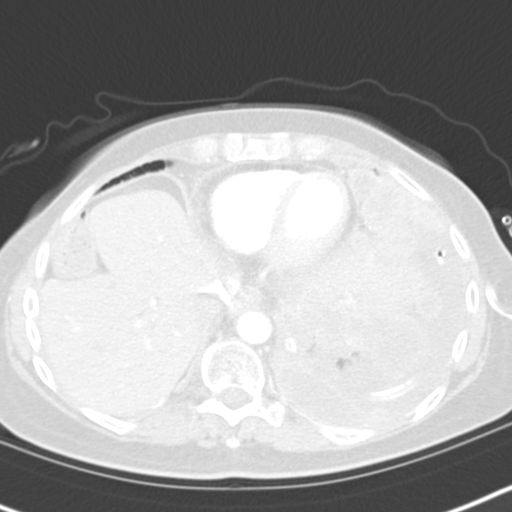
[im 30/85  mediastinal]
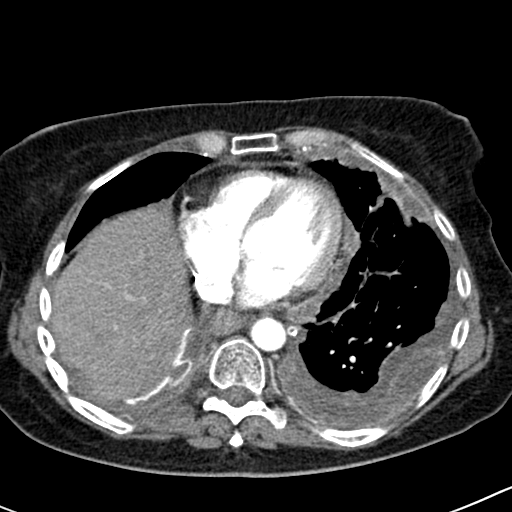
[im 30/85  lung]
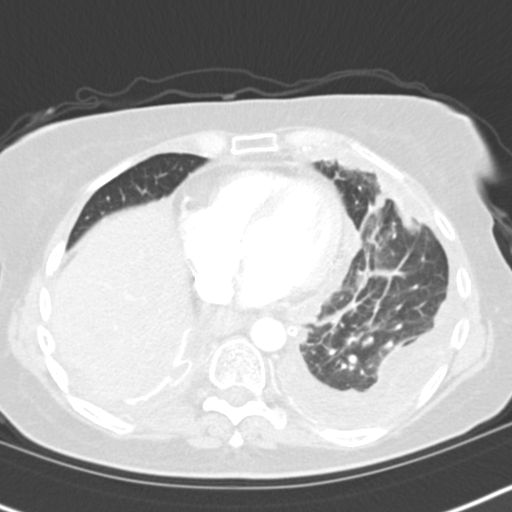
[im 36/85  lung]
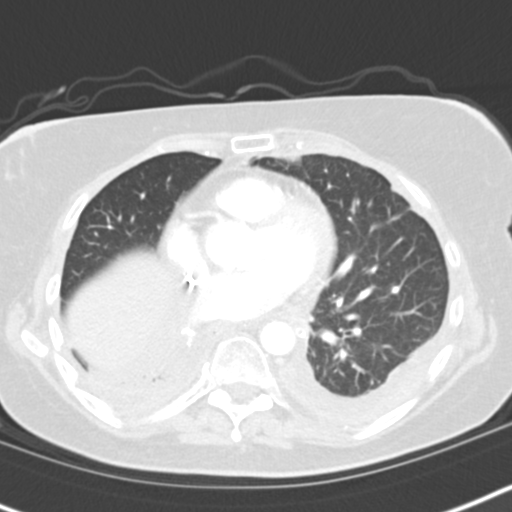
[im 37/85  lung]
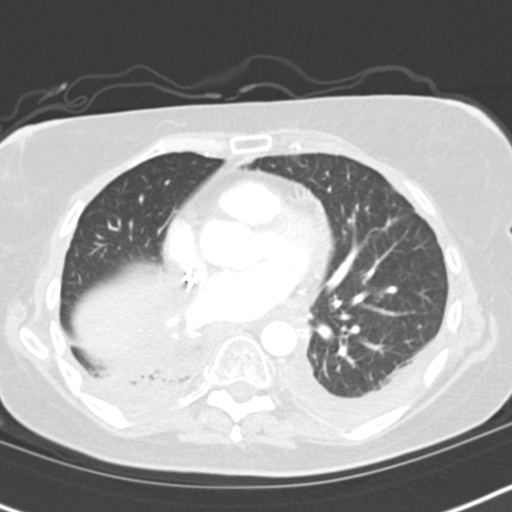
[im 43/85  lung]
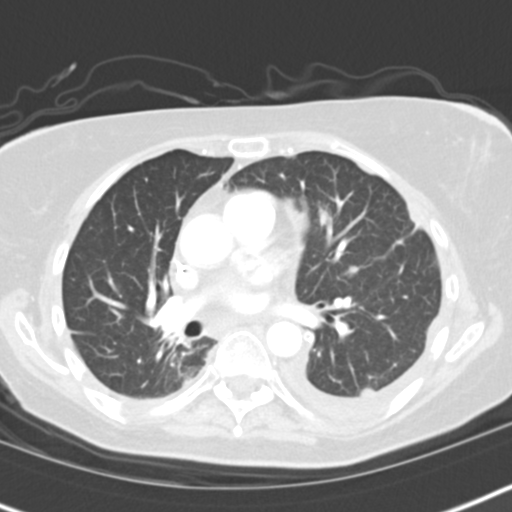
[im 48/85  mediastinal]
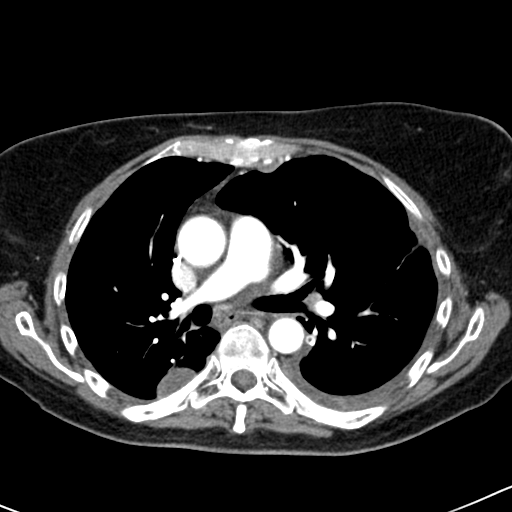
[im 48/85  lung]
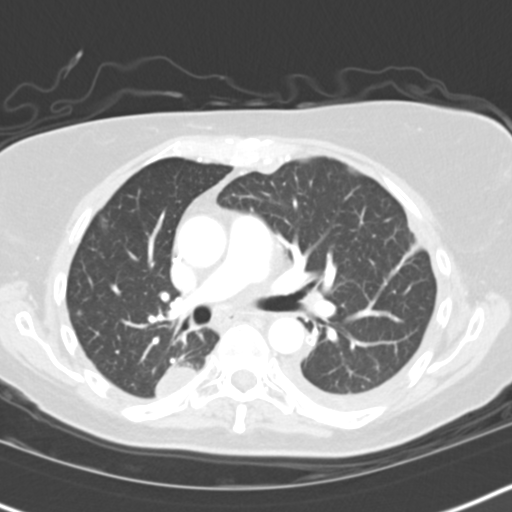
[im 52/85  lung]
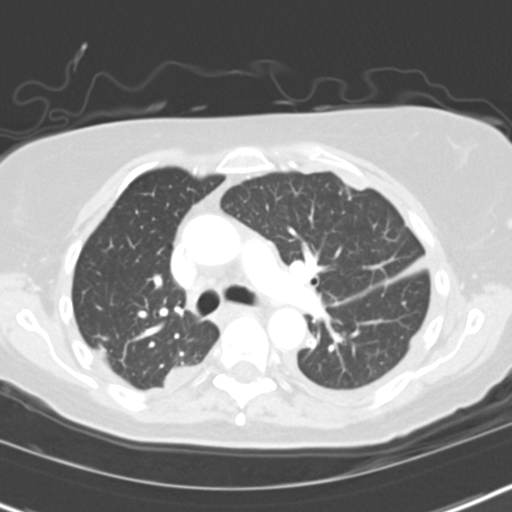
[im 59/85  lung]
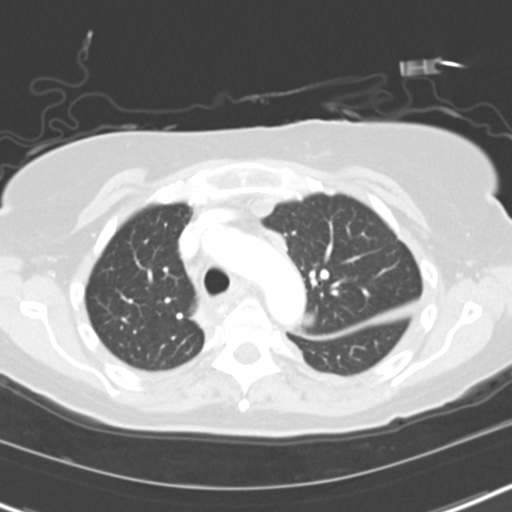
[im 64/85  lung]
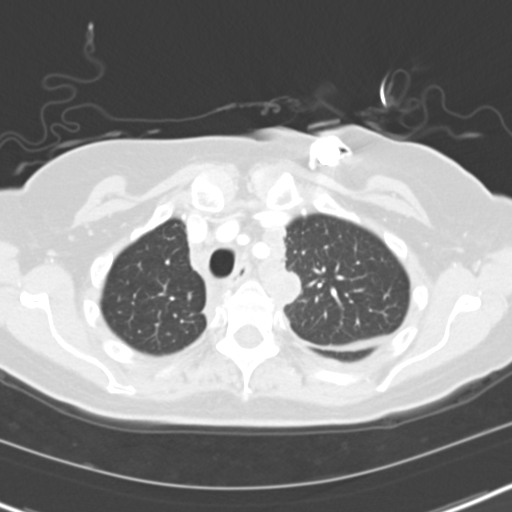
[im 66/85  mediastinal]
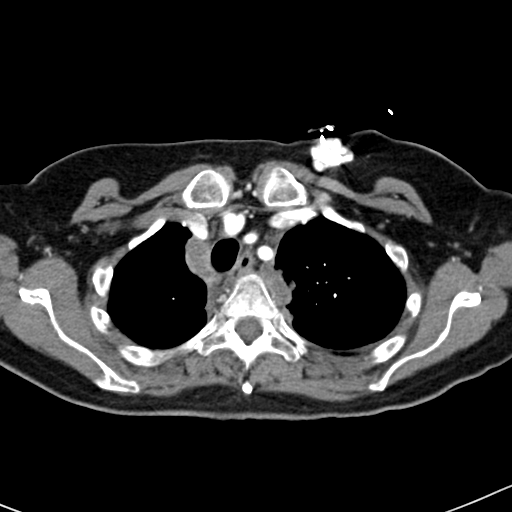
[im 66/85  lung]
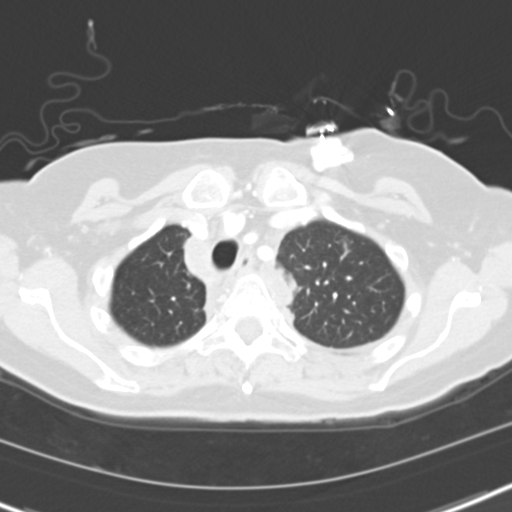
[im 74/85  lung]
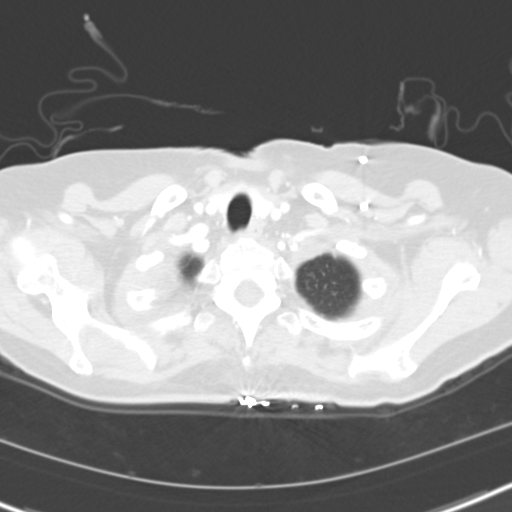
[im 81/85  lung]
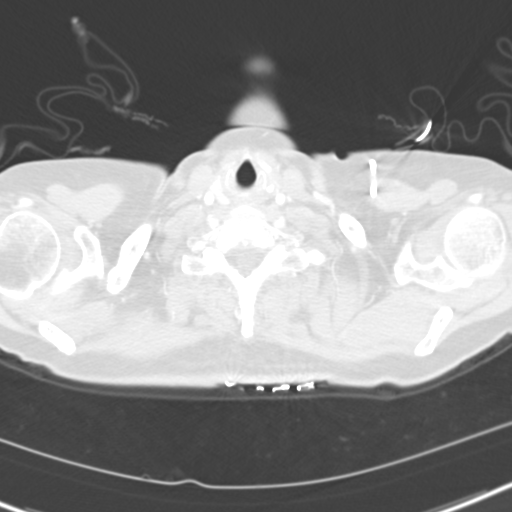

[15 of 31 positions shown; findings below may reference images not displayed]

FINDINGS: The central airways are patent. There is a 2.8 cm pleural-based right upper
lung mass which has enlarged compared with 01/21/2011. There is a small
right pleural effusion. There is right lower lobe airspace disease. There is
a small left pleural effusion. There is irregular left pleural thickening
with multiple modules which are enlarged compared with 01/21/2011 consistent
with progression of disease. There is a right upper paratracheal mass
measuring 14 mm, new compared with the prior exam. There is a new left
prevascular mass measuring 2.2 cm x 1 cm. There is no pleural effusion or
pneumothorax.

The heart size is normal. There is no pericardial effusion. The thoracic
aorta is normal in caliber.

Review of bone windows demonstrates no focal lytic or sclerotic lesions.

Limited noncontrast images of the upper abdomen were obtained. The adrenal
glands appear normal. The remainder of the visualized abdominal organs are
unremarkable.
IMPRESSION: 1. Interval progression of disease compared with 01/21/2011. There is an
enlarging right pleural based mass. There is significant progression of left
pleural metastatic disease. There are new masses in the right upper
paratracheal region and left prevascular region.

[REDACTED]

## 2014-07-31 NOTE — Discharge Summary (Signed)
PATIENT NAME:  Ashley, Flowers MR#:  [id number] DATE OF BIRTH:  1965/12/26  DATE OF ADMISSION:  06/07/2011 DATE OF DISCHARGE:  06/07/2011  PRESENTING COMPLAINT: Possible atrial fibrillation.   DISCHARGE DIAGNOSES:  1. Dysrhythmia, appears to be sinus tachycardia with ectopy and some PACs.  2. Metastatic lung cancer.  3. Seizure disorder.  4. Urinary tract infection.   CONDITION ON DISCHARGE: Fair. Vitals stable.   MEDICATIONS:  1. Alprazolam 0.5 mg, 1 to 2 tablets orally as needed for anxiety.  2. Duke's Mouthwash 5 times a day as needed.  3. Oxycodone 5 mg, 1 to 2 every 4 to 6 hours as needed.  4. Promethazine 25 mg, 1 tablet 4 to 6 hours as needed.  5. Keppra 500-mg tablet, 1000 mg b.i.d.  6. Lansoprazole 15 mg, 1 tablet as needed.  7. Ambien 10 mg, 1 tablet daily as needed.  8. Hydrocodone liquid, 1 teaspoon every 12 hours p.r.n.  9. Levaquin 500 mg p.o. daily as prescribed by Dr. Ma Hillock.  10. Mag-Ox 400 mg p.o. daily for five days.   DIET: Regular.   FOLLOWUP: 1. Follow up with Dr. Ma Hillock on scheduled appointment.   2. Follow up with Dr. Rockey Situ if needed.   LABS ON DISCHARGE: Cardiac enzymes times three negative. EKG shows sinus rhythm with short PR intervals. Blood culture no growth in 48 hours. Urinalysis is negative for urinary tract infection.  Chest x-ray showed stable-appearing chest as compared to 05/09/2011. Bilateral pleural effusions again noted. Comprehensive metabolic panel within normal limits. White count is 2.3. Hemoglobin and hematocrit 12.3 and 35.7, platelet count 26.   CONSULTATIONS: Cardiology consultation with Dr. Rockey Situ.   BRIEF SUMMARY OF HOSPITAL COURSE: [name] is a 49 year old Caucasian female with past medical history of metastatic lung cancer status post right thoracotomy, brain tumor resection, and multiple chemo regimens, recurrent left pleural effusion status post left Pleurx  catheter, seizure disorder, and C. difficile colitis who comes to the  Emergency Room for evaluation of possible atrial fibrillation captured on a Holter monitor. The patient was admitted with:  1. Dysrhythmia, suspected atrial fibrillation: The patient's rhythm strips were reviewed by Dr. Rockey Situ.  It looked like it was tachycardia with PACs and ectopy. No atrial fibrillation was noted in the Emergency Room or on telemetry monitoring while the patient was admitted.  The patient was thought to have developed cardiac dysrhythmia from Votrient.  She has held that medication for four days; however, after evaluation Dr. Rockey Situ recommended the patient should continue taking her chemotherapy medicine as before.  2. Urinary tract infection: Levaquin will be continued as given by Dr. Ma Hillock as outpatient.  3. Pancytopenia: Stable.  4. Seizure disorder: On Keppra.  5. Hypomagnesemia: The patient was repleted with p.o. magnesium in the hospital and given a prescription to take home as well.  6. Hospital stay otherwise remained stable.  7. CODE STATUS: The patient remained a FULL CODE.   TIME SPENT: 40 minutes.  ____________________________ Hart Rochester Posey Pronto, MD sap:bjt D: 06/12/2011 06:19:43 ET T: 06/12/2011 15:18:38 ET JOB#: [id number]  cc: Mitul Hallowell A. Posey Pronto, MD, <Dictator> Minna Merritts, MD Sandeep R. Ma Hillock, MD Ilda Basset MD ELECTRONICALLY SIGNED 06/20/2011 16:01

## 2014-07-31 NOTE — Consult Note (Signed)
PATIENT NAME:  Ashley Flowers, Ashley Flowers MR#:  [id number] DATE OF BIRTH:  04/28/1965  DATE OF CONSULTATION:  06/21/2011  REFERRING PHYSICIAN:  Loletha Grayer, MD  CONSULTING PHYSICIAN:  Naw Lasala R. Ma Hillock, MD  REASON FOR CONSULTATION: Thrombocytopenia, hematuria.   HISTORY OF PRESENT ILLNESS: The patient is a 49 year old female patient with known history of stage IV PNET carcinoma status post multiple chemotherapy regimens, more recently on low dose Votrient 200 mg daily. The patient also has had chronic thrombocytopenia since she has received heavy chemotherapy treatments over a long period of time. Platelet count was 23 on March 14th with hemoglobin of 10.5. The patient developed gross hematuria for the first time last night and came to the Emergency Room and was admitted to the hospital. She has history of platelet transfusion. States that urine has cleared up. Urinalysis shows 3+ blood along with 741 RBCs and 40 WBCs. Leukocyte esterase and nitrite are negative. The patient also states that she has history of passing multiple kidney stones in the past. A lot of times it has been without symptoms also. Urinalysis does show calcium oxalate crystals present. Currently otherwise feels much better. Pain is under control. Appetite is fairly steady. Denies any recurrent episodes of palpitation, syncope, lightheadedness, or dizziness.   PAST MEDICAL HISTORY/PAST SURGICAL HISTORY: 1. Stage IV PNET carcinoma as described above.  2. Hypertension.  3. Migraines. 4. Depression.  5. Cholecystectomy October 2010. 6. Episode of febrile neutropenia August 2012 with history of C. difficile diarrhea at that time.   FAMILY HISTORY: Noncontributory.   SOCIAL HISTORY: Nonsmoker. Denies alcohol usage. Lives with her husband.   ALLERGIES: Sulfa, erythromycin, Flagyl, vancomycin, Zofran.   HOME MEDICATIONS:  1. Xanax 0.5 to 1 mg t.i.d. p.r.n. anxiety.  2. Duke's Mouthwash p.r.n.  3. Oxycodone 5 to 10 mg q.4 to 6 hours  p.r.n.  4. Phenergan 25 mg q.4 to 6 hours p.r.n.  5. Keppra 1000 mg b.i.d.  6. Lansoprazole 15 mg daily p.r.n.  7. Ambien 10 mg at bedtime p.r.n.  8. Vitamin B12 1000 mcg daily.  9. Folic acid 0.4 mg daily.  10. Votrient 200 mg daily.   REVIEW OF SYSTEMS: CONSTITUTIONAL: As in history of present illness. No fevers or chills. HEENT: No headaches, dizziness, epistaxis, ear or jaw pain. CARDIAC: No angina, palpitation, orthopnea, or PND. LUNGS: No dyspnea at rest, cough, sputum, or hemoptysis. Has mild dyspnea on exertion. GI: No nausea, vomiting, or diarrhea. No blood in stools or melena. GU: As in history of present illness. No dysuria. HEMATOLOGIC: No other bleeding symptoms. SKIN: No new rashes or pruritus. NEUROLOGIC: No new focal weakness, seizures, or loss of consciousness. ENDOCRINE: No polyuria or polydipsia.   PHYSICAL EXAMINATION:   GENERAL: The patient is sitting in bed alert and oriented in no acute distress. No icterus. Mild pallor.   VITAL SIGNS: Temperature 98.6, pulse 91, respirations 18, blood pressure 116/79, 95% on room air.   HEENT: Normocephalic, atraumatic. Extraocular movements intact. Sclerae anicteric. No oral thrush or petechiae.   NECK: Supple without lymphadenopathy.   CARDIOVASCULAR: S1 and S2, regular rate and rhythm.   LUNGS: Lungs show decreased breath sounds in the right lower lobe area, left lower lobe area, otherwise no rhonchi or crepitation.   ABDOMEN: Soft, nontender. No organomegaly or suprapubic tenderness. Bowel sounds normally heard.   EXTREMITIES: No edema.   SKIN: No major bruising or ecchymosis.   NEUROLOGIC: Alert and oriented, cranial nerves are intact, nonfocal.   LABORATORY, DIAGNOSTIC, AND RADIOLOGICAL DATA:  As in history of present illness. In addition, repeat hemoglobin at 1:50 p.m. done today shows WBC count of 2000, ANC 1500, hemoglobin 9.7, platelets 47, creatinine 0.68. Liver functions unremarkable. Potassium 4.0.   IMPRESSION  AND RECOMMENDATION: This is a 49 year old female patient with stage IV PNET carcinoma on palliative treatment with low dose Votrient 200 mg daily. She has chronic thrombocytopenia related to heavy chemotherapy treatments in the past along with Votrient effect at this time. The patient for the first time developed gross bloody urine and has been hospitalized. She has history of platelet transfusion and urine has cleared up clinically. Urinalysis does not indicate obvious infection, calcium oxalate crystals present, possibly hematuria could be related to passing asymptomatic stone. Will also get urine culture to make sure no UTI. Platelet count is improved to 47 posttransfusion and she has no other bleeding symptoms clinically, feels better. No new recommendations at this time from an Oncology standpoint. She will be discharged home. She was advised to keep follow-up appointments at [facility] same as previously scheduled starting next Wednesday, March 20th. In between visits, she was advised to call or come to the ER in case of any recurrent bleeding or acute sickness. The patient is agreeable to this plan.   Thank you for the referral. Please feel free to contact me if any additional questions.   ____________________________ Rhett Bannister Ma Hillock, MD srp:drc D: 06/21/2011 17:35:00 ET T: 06/21/2011 17:56:02 ET JOB#: [id number]  cc: Rileigh Kawashima R. Ma Hillock, MD, <Dictator> Alveta Heimlich MD ELECTRONICALLY SIGNED 06/21/2011 22:47

## 2014-07-31 NOTE — H&P (Signed)
PATIENT NAME:  Ashley, Flowers MR#:  [id number] DATE OF BIRTH:  05-30-1965  DATE OF ADMISSION:  06/07/2011  REFERRING PHYSICIAN: Pollie Friar, MD  PRIMARY ONCOLOGIST: Ashley Alf, MD  PRESENTING COMPLAINT: Advised to come to the emergency room for evaluation based on questionable atrial fibrillation noted on Holter monitor.   HISTORY OF PRESENT ILLNESS: Ashley Flowers is a 49 year old woman with history of metastatic lung cancer to brain status post multiple chemotherapy regimens with history of recurrent left pleural effusion status post Pleurx catheter who has since been started Votrient, on 04/07/2011. The patient had a follow up with Dr. Leia Flowers on 05/09/2011 where EKG obtained, compared to the EKG from 12/19 2012, was changed and noted for sinus bradycardia, PVCs, and junctional escape complexes. Therefore, Ashley Flowers referred the patient to see [facility]. She saw Ashley Flowers on 05/10/2011 and was initiated on a Holter monitor  and thus because of the recorded rhythm the patient was advised to come to get evaluated. She denies any subjective palpitations or arrhythmia, no presyncope or syncope. The patient does report that she has had episodes of nausea and vomiting that has been thought to be related to these irregular heart rhythms. She reports chest pain, but that has not changed related to her lung cancer as well as bilateral arm pain She reports four days ago developing productive cough, no hemoptysis. By Wednesday she had worsening symptoms and was initiated on Levaquin by Ashley Flowers. However, she reports that her symptoms continued to progress and she noticed some wheezing and now has developed some nausea with noted low-grade fever in initial ED evaluation.   PAST MEDICAL HISTORY:  1. Seizure disorder.  2. Febrile neutropenia in August 2012.  3. Clostridium difficile colitis.  4. Hypertension.  5. Migraines.  6. Depression.  7. Stage IV lung cancer with brain mets status post  multiple chemotherapy regimens with history of recurrent left pleural effusion, status post Pleurx catheter. She is status post right thoracotomy as well as brain tumor resection.   PAST SURGICAL HISTORY:  1. As above. 2. Cholecystectomy.  3. Port-A-Cath placed and removed.   ALLERGIES: Erythromycin, Flagyl, sulfa, vancomycin, Zofran.   MEDICATIONS:  1. Duke's mouthwash 5 mL five times a day as needed. She is currently not using this.  2. Oxycodone 5 mg 1 to 2 tablets every 4 to 6 hours as needed. 3. Promethazine 25 mg every 4 to 6 hours as needed.  4. Keppra 1000 mg twice a day. 5. Alprazolam 0.5 mg three times daily as needed.  6. Senna 8.6 mg at bedtime as needed.  7. Lansoprazole 15 mg daily as needed.  8. Percocet 2.5/325 mg one tablet every six hours as needed.  9. Hydrocodone/CPM 1 tsp every 12 hours as needed.  10. Levaquin daily, started on 06/05/2011, per patient.   FAMILY HISTORY:  Grandmother had breast cancer and there is a family history of heart disease.   SOCIAL HISTORY: She lives in [place] with her husband. No tobacco history, alcohol, or drug use. She has two children.  REVIEW OF SYSTEMS: CONSTITUTIONAL: She has not noticed subjective fever. She was found to have low grade fever in the ED. Endorses generalized weakness and nausea. EYES: No visual disturbance. ENT: She has had some congestion, no epistaxis. RESPIRATORY: As per history of present illness with cough and wheezing. No hemoptysis. Endorses shortness of breath. CARDIOVASCULAR: As per history of present illness. GI: History of vomiting and recent nausea, episodic. No diarrhea, abdominal pain,  hematemesis, or melena. GU: No dysuria, hematuria.  ENDOCRINE: No polyuria or polydipsia. HEMATOLOGIC: No easy bleeding. SKIN: No ulcers. MUSCULOSKELETAL: She has arm pain bilaterally and occasional joint pain. NEURO: No prior history of stroke. She has history of seizure. PSYCH: Endorses depression. No suicidal ideation.    PHYSICAL EXAMINATION:   VITAL SIGNS: Temperature 99.5, pulse 114, respiratory rate 18, blood pressure 116/60, and saturating 98% on room air.   GENERAL: Lying in bed, weak appearing, in no apparent distress.   HEENT: Normocephalic, atraumatic. Pupils equal and symmetric, anicteric. Nares without discharge. Moist mucous membranes.   NECK: Soft and supple. No adenopathy or JVP.   CARDIOVASCULAR: Slightly tachy. No murmurs, rubs, or gallops.   LUNGS: Coarse breath sounds and basilar crackles. No use of accessory muscles or increased respiratory effort.   ABDOMEN: Soft. Positive bowel sounds. No mass appreciated.   EXTREMITIES: Trace edema bilaterally. Dorsal pedis pulses intact.   MUSCULOSKELETAL: No joint effusion.   SKIN: No ulcers.   NEUROLOGIC: No dysarthria or aphasia. Symmetrical strength. No focal deficits.   PSYCH: She is alert and oriented. The patient is cooperative.   PERTINENT LABS AND STUDIES: Urinalysis with specific gravity of 1.02, blood 2+, pH 6, leukocyte esterase 2+, RBC 32 per high-power field, WBC 8 per high-power field.  WBC 2.3, hemoglobin 11.1, hematocrit 33.3, platelets 26, MCV 102. Magnesium 1.7. Glucose 98, BUN 9, creatinine 0.71, sodium 137, potassium 3.8, chloride 101, carbon dioxide 28, calcium 8.8, total bilirubin 0.6, alkaline phosphatase 120, ALT 24, AST 32, total protein 6.8.   EKG with sinus tachycardia of 108. No ST changes.   Chest x-ray with bilateral effusion. Right hemidiaphragm is raised. There is a catheter in the left lung. No significant changes from x-ray compared to 05/09/2011.   ASSESSMENT AND PLAN: Ashley Flowers is a 49 year old with history of metastatic lung cancer status post right thoracotomy, brain tumor resection and multiple chemotherapy regimens with recurrent left pleural effusion status Pleurx catheter, seizure disorder, C. difficile colitis, and hypertension presenting via instruction to be evaluated for possible atrial  fibrillation captured on Holter monitor.  1. Cardiac dysrhythmia, possibly in the setting of Votrient. She has held her medication since four days ago. We will continue to hold, continue on telemetry. Her EKG and telemetry so far are unrevealing.  We will go ahead and get cardiology consultation. Further diagnostics per their recommendations. She is noted to have a mild urinary tract infection. Her chest x-ray appears unchanged from 04/21/2011. Questionable if there is underlying infection. She has been initiated on Levaquin without improvement. We will change to cefepime for now. Notify Ashley Flowers of the patient's admission status. 2. Pancytopenia, stable: Her A1c is greater than 500. We will not need to be place on neutropenic precautions.  3. Urinary tract infection: As above, she will be on cefepime. Urine cultures are pending.  4. Hypomagnesemia: Replace as needed.  5. Seizure disorder: Restart Keppra.  6. Hypertension: She is not on any medications. 7. Prophylaxis with TEDs, SCDs, and Protonix.   TIME SPENT: Approximately 45 minutes was spent on patient care. ____________________________ Rita Ohara, MD ap:slb D: 06/07/2011 06:13:38 ET T: 06/07/2011 08:33:59 ET JOB#: [id number]  cc: Michaelah Credeur, MD, <Dictator> Sandeep R. Ma Hillock, MD Rita Ohara MD ELECTRONICALLY SIGNED 06/09/2011 0:00

## 2014-07-31 NOTE — H&P (Signed)
PATIENT NAME:  Ashley Flowers, Ashley Flowers MR#:  [id number] DATE OF BIRTH:  02-19-1966  DATE OF ADMISSION:  06/21/2011  PRIMARY CARE PHYSICIAN: Leia Alf, MD  CHIEF COMPLAINT: Blood in the urine.   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 She has had pancytopenia in the past. She saw the doctor yesterday and was told her platelet count was low and if she developed any bleeding to call. Today she developed pure blood in the urine and it was after hours so the oncologist advised her to come into the Emergency Room for further evaluation. In the Emergency Room, her platelet count was 23,000 and they recommended a platelet transfusion with checking platelets afterwards. She did have 3+ blood in the urine. The patient currently feels okay. Hospitalist services were contacted for further evaluation.   PAST MEDICAL HISTORY:  1. 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 2. History of seizure disorder. 3. Gastroesophageal reflux disease. 4. Anxiety.   PAST SURGICAL HISTORY:  1. Cholecystectomy.  2. Tumors on the lung and brain removed. 3. Pleurx catheter. 4. Port placement.   ALLERGIES: Erythromycin, Flagyl, sulfa, vancomycin, Zofran.   CURRENT MEDICATIONS:  1. Keppra 1000 mg twice a day p.r.n.  2. Xanax 0.5 mg p.r.n. anxiety. 3. Oxycodone 5 mg 1 to 2 tablets every 4 to 6 hours as needed.  4. Promethazine 25 mg every 4 to 6 hours as needed. 5. Lansoprazole 15 mg as needed. 6. Votrient 200 mg daily.   SOCIAL HISTORY: No smoking. No alcohol. No drug use. She lives with her husband and two children. She used to work in the school system. Currently on disability.   FAMILY HISTORY: Father with a cerebrovascular accident at age 20, but died at age 82. Mother is healthy. Grandmother with breast cancer.   REVIEW  OF SYSTEMS: CONSTITUTIONAL: Positive for sweats. No fever or chills. Positive for weight loss of 50 pounds. Positive for fatigue. EYES: She does wear contacts. EARS, NOSE, MOUTH, AND THROAT: Positive for nasal drip. No sore throat. No difficulty swallowing.  CARDIOVASCULAR: History of tachycardia. No palpitations. No chest pain. RESPIRATORY: No shortness of breath. No coughing. No sputum. No hemoptysis. GASTROINTESTINAL: Occasional nausea and vomiting. Positive for abdominal pain. No diarrhea. Occasional constipation. No bright red blood per rectum. No melena. GENITOURINARY: Positive for blood in the urine. MUSCULOSKELETAL: No joint pain. NEUROLOGIC: No fainting or blackouts. History of seizure. PSYCHIATRIC: Positive for anxiety. ENDOCRINE: No thyroid problems. HEMATOLOGIC/LYMPHATIC: Positive for pancytopenia.   PHYSICAL EXAMINATION:  VITAL SIGNS: Temperature 97.9, pulse 99, respirations 16, blood pressure 117/85, pulse oximetry 97% percent.   GENERAL: No respiratory distress.   EYES: Conjunctivae normal. Lids normal. Pupils equal, round, and reactive to light. Extraocular muscles intact. No nystagmus.   EARS, NOSE, MOUTH, AND THROAT: Tympanic membranes no erythema. Nasal mucosa no erythema. Throat no erythema. No exudate seen. Lips and gums no lesions.   NECK: No JVD. No bruits. No lymphadenopathy. No thyromegaly. No thyroid nodules palpated.   RESPIRATORY: Lungs are clear to auscultation. No use of accessory muscles to breathe. No rhonchi, rales, or wheeze heard.   HEART: S1 and S2 normal. No gallops, rubs, or murmurs heard. Carotid upstroke 2+ bilaterally. No bruits.   EXTREMITIES: Dorsalis pedis pulses 2+ bilaterally. No edema to lower extremities.   ABDOMEN: Soft  and nontender. No organomegaly/splenomegaly. Normoactive bowel sounds. No masses felt.   LYMPHATIC: No lymph nodes in the neck.   MUSCULOSKELETAL: No clubbing. No edema. No cyanosis.   SKIN: No rashes or  lesions.  NEUROLOGIC: Cranial nerves II through XII grossly intact. Deep tendon reflexes 2+ bilateral lower extremities.   PSYCHIATRIC: The patient is oriented to person, place, and time.   LABS/STUDIES: White blood cell count 2.3, hemoglobin and hematocrit 10.5 and 31.3, platelet count 23, and MCV 103. Glucose 82, BUN 15, creatinine 0.68, sodium 142, potassium 4.0, chloride 104, CO2 30, and calcium 9.0. Liver function tests normal.  Urinalysis: 3+ blood.   ASSESSMENT AND PLAN:  1. Thrombocytopenia with hematuria: We will transfuse 1 unit of platelets and get a CBC afterwards, get an oncology consultation. We will hold off on urology consultation. We will give gentle IV fluid hydration.  2. Stage IV PNET carcinoma: We will consult Dr. Leia Alf about dosing on the Votrient and discuss the  prognosis. The patient does wish to be a DO NOT RESUSCITATE.  3. Seizure disorder: Continue Keppra.        4. Gastroesophageal reflux disease: Continue Prevacid as needed.   TIME SPENT ON ADMISSION: 50 minutes. ____________________________ Tana Conch. Leslye Peer, MD rjw:slb D: 06/21/2011 04:00:42 ET T: 06/21/2011 08:06:06 ET JOB#: [id number]  cc: Tana Conch. Leslye Peer, MD, <Dictator> Sandeep R. Ma Hillock, MD Marisue Brooklyn MD ELECTRONICALLY SIGNED 06/22/2011 19:08

## 2014-07-31 NOTE — Consult Note (Signed)
General Aspect Ashley Flowers is a 49 y.o. female with a PNET metastatic cancer who has widely metastatic disease including brain metastases for which is undergone surgery and is now on palliative therapy with VOTRIENT which was started in Dec 2012 and assoc with imporved breathing and less pain. She was started on a Holter monitor after previous EKG showed junctional rhythm, bradycardia with a long pause more than 3 seconds. [employer] monitoring company called her last night and reported she had a arrhythmia, possible atrial fibrillation and they suggested she go to the emergency room for EKG. Cardiology was called this morning for consult for arrhythmia.  She reports that she is tired, has had no symptoms of nausea, lightheadedness or near syncope last night. She did have 2 episodes on Monday February 25. All of her event monitor strips have been reviewed over the past month no significant arrhythmia noted. She does have long periods of sinus tachycardia with heart rate in the low 100s, frequent APCs with minimal compensatory pause  after the ectopy. The events/symptoms she had on Monday this past week did not associate with any arrhythmia.   She continues to have progressively metastatic disease. Evaluation of her heart in the past included extensive testing in [place] the fall 2011 which is normal.    Present Illness . FAMILY HISTORY:  Grandmother had breast cancer and there is a family history of heart disease.   SOCIAL HISTORY: She lives in [place] with her husband. No tobacco history, alcohol, or drug use. She has two children.   Physical Exam:   GEN WD, WN, NAD, thin    HEENT red conjunctivae    NECK supple  No masses    RESP normal resp effort  clear BS    CARD Regular rate and rhythm  No murmur    ABD denies tenderness  soft    LYMPH negative neck    EXTR negative edema    SKIN normal to palpation    NEURO cranial nerves intact, motor/sensory function intact    PSYCH  alert, A+O to time, place, person, good insight, fatigued   Review of Systems:   Subjective/Chief Complaint Fatigue, extreme exhaution for not sleeping last night    General: No Complaints    Skin: No Complaints    ENT: No Complaints    Eyes: No Complaints    Neck: No Complaints    Respiratory: No Complaints    Cardiovascular: No Complaints    Gastrointestinal: No Complaints    Genitourinary: No Complaints    Vascular: No Complaints    Musculoskeletal: No Complaints    Neurologic: No Complaints    Hematologic: No Complaints    Endocrine: No Complaints    Psychiatric: No Complaints    Review of Systems: All other systems were reviewed and found to be negative    Medications/Allergies Reviewed Medications/Allergies reviewed     Seizures:    kidney stones:    tumors on both lungs:    lung ca:    migraines:    hypertension:    pleurx catheter to left lung:    right lower lobe removed with part of diaphram:    brain tumor removed:    power port:    Cholecystectomy: Oct 2010       Admit Diagnosis:   DYSRHYTHMIA: 07-Jun-2011, Active, DYSRHYTHMIA      Admit Reason:   Cardiac dysrhythmia: (427.9) Active, ICD9, Unspecified cardiac dysrhythmia  Home Medications: Medication Instructions Status  ALPRAZolam 0.5 mg  tablet 1 to 2 tab(s) orally by mouth as needed for Anxiety, Nervousness Active  Duke's Mouthwash 5 milliliter(s) orally 5 times a day, As Needed, sore tongue/mouth, #480 ml, with 2 refills... Active  oxyCODONE 5 mg tablet Take 1 - 2 tablets orally every 4 - 6 hours as needed for pain Active  promethazine 25 mg oral tablet 1 tab(s) orally every 4 to 6 hours as needed for nausea or vomiting  Active  Keppra 500 mg oral tablet 2  tablets (1000 mg) orally 2 times a day Active  lansoprazole 15 mg oral delayed release capsule 1  orally  as needed Active  Ambien 10 mg oral tablet 1 tab(s) orally once a day (at bedtime) as needed Active  hydrocodone  liquid 1 tsp every 12hours prn  Active  Levaquin 500 mg oral tablet 1 tab(s) orally every 24 hours Active     Routine Hem:  28-Feb-13 23:50    WBC (CBC) 2.3   RBC (CBC) 3.27   Hemoglobin (CBC) 11.1   Hematocrit (CBC) 33.3   Platelet Count (CBC) 26   MCV 102   MCH 34.1   MCHC 33.4   RDW 19.8   Neutrophil % 73.5   Lymphocyte % 16.3   Monocyte % 9.6   Eosinophil % 0.2   Basophil % 0.4   Neutrophil # 1.7   Lymphocyte # 0.4   Monocyte # 0.2   Eosinophil # 0.0   Basophil # 0.0   Reference Accession# [id number]  Routine Chem:  28-Feb-13 23:50    Magnesium, Serum 1.7   Glucose, Serum 98   BUN 9   Creatinine (comp) 0.71   Sodium, Serum 137   Potassium, Serum 3.8   Chloride, Serum 101   CO2, Serum 28   Calcium (Total), Serum 8.8  Hepatic:  28-Feb-13 23:50    Bilirubin, Total 0.6   Alkaline Phosphatase 120   SGPT (ALT) 24   SGOT (AST) 32   Total Protein, Serum 6.8   Albumin, Serum 3.7  Routine Chem:  28-Feb-13 23:50    Osmolality (calc) 272   eGFR (African American) >60   eGFR (Non-African American) >60   Anion Gap 8   EKG:   Interpretation Sinus tachycardia with rate 108 bpm, rare APC   Radiology Results: XRay:    28-Feb-13 23:59, Chest PA and Lateral   Chest PA and Lateral    REASON FOR EXAM:    cough with cancer  COMMENTS:       PROCEDURE: DXR - DXR CHEST PA (OR AP) AND LATERAL  - Jun 06 2011 11:59PM     RESULT: Comparison is made to the prior exam of 05/09/2011. Bibasilar   pleural effusions are again seen. There is again observed a hazy density   at the left base. No corresponding density is seen in the lateral view   and the finding likely represents pleural thickening, pleural fibrosis or   a pleural-based mass. There is again noted a focal nodular thickening of   the pleura along the left lateral hemithorax at the mid lung level. These   findings appear stable as compared to the prior exam 05/09/2011.   Additional evaluation of the chest by repeat CT  might be helpful if such   is clinically indicated.    No new pulmonary infiltrates are seen. Heart size is normal. A   Port-A-Cath is present. There is a left chest tube observed with the tip   projected at the left costophrenic angle.  IMPRESSION:   1. Stable-appearing chest as compared to the exam of 05/09/2011.  2. Bibasilar pleural effusions are again noted.  3. There is again noted a hazy density at the left lung base as mentioned   above.  4. There is focal thickening of the pleural shadow laterally on the left   compatible with pleural nodule or pleural fibrosis and which has not   changed appreciably since the exam of 05/09/2011.  5. Heart size remains normal.  6. No pulmonary edema is seen.    Thank you for the opportunity to contribute to the care of your patient.           Verified By: Ciro Backer. WALL, M.D., MD    Sulfa drugs: N/V/Diarrhea  Erythromycin: N/V/Diarrhea  Other- Explain in Comments Line: Itching  Flagyl: Seizures  vancomycin: Rash, Itching  Zofran ODT: Other, Hallucinations  Vital Signs/Nurse's Notes: **Vital Signs.:   01-Mar-13 06:30   Vital Signs Type Admission   Temperature Temperature (F) 99.9   Celsius 37.7   Temperature Source oral   Pulse Pulse 106   Pulse source per Dinamap   Respirations Respirations 20   Systolic BP Systolic BP 224   Diastolic BP (mmHg) Diastolic BP (mmHg) 56   Mean BP 71   BP Source Dinamap   Pulse Ox % Pulse Ox % 97   Pulse Ox Activity Level  At rest   Oxygen Delivery Room Air/ 21 %     Impression 49 y.o. female with a metastatic cancer who has widely metastatic disease including brain metastases for which is undergone surgery and is now on palliative therapy with VOTRIENT which was started in Dec 2012,  started on a Holter monitor after previous EKG showed junctional rhythm, bradycardia with a long pause more than 3 seconds. [employer] monitoring company called her last night and reported she had a arrhythmia,  possible atrial fibrillation and they suggested she go to the emergency room for EKG.   review of tele strips from monitor, ekg at [facility], and tele while she has been an inpatient has not shown significant arrhythmia, rather sinus tachycardia with APCs.  She did have symptoms of nausea, malaise on February 25  This was not associated with arrhythmia when the event monitor was reviewed.  No further cardiac workup is indicated at this time. We would suggest she stay on the medication for her neoplasm as there has not been any significant bradycardia. Event monitor will terminate on Saturday. Case was discussed with Dr. Jolyn Nap. Would recommend that she followup with him in the next several weeks at the [facility] office.   Electronic Signatures: Ida Rogue (MD)  (Signed 01-Mar-13 12:26)  Authored: General Aspect/Present Illness, History and Physical Exam, Review of System, Past Medical History, Health Issues, Home Medications, Labs, EKG , Radiology, Allergies, Vital Signs/Nurse's Notes, Impression/Plan   Last Updated: 01-Mar-13 12:26 by Ida Rogue (MD)

## 2014-07-31 NOTE — Discharge Summary (Signed)
PATIENT NAME:  Ashley Flowers, Ashley Flowers MR#:  [id number] DATE OF BIRTH:  [birth year]  DATE OF ADMISSION:  06/21/2011 DATE OF DISCHARGE:  06/21/2011  PRIMARY CARE PHYSICIAN: Dr. Ma Hillock   REASON FOR ADMISSION: Blood in the urine.   DISCHARGE DIAGNOSES:  1. Hematuria.  2. Thrombocytopenia requiring platelet transfusion given hematuria.  3. 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  4. History of pancytopenia. 5. History of metastatic PNET cancer.  6. History of seizure disorder.  7. History of gastroesophageal reflux disease.  8. 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 The patient is on chemotherapy.  9. History of malignant pleural effusion status post Pleurx catheter insertion. 10. History of recurrent nephrolithiasis.  11. History of anxiety.   CONSULT: Hematology/Oncology with Dr. Ma Hillock    DISCHARGE DISPOSITION: Home.   DISCHARGE MEDICATIONS:  1. Percocet 5/325 one tab p.o. q.6 hours p.r.n. pain. 2. Keppra 1000 mg p.o. b.i.d. 3. Alprazolam 0.5 mg 1 to 2 tablets p.o. as needed for anxiety or nervousness. 4. Duke's mouthwash 5 mL p.o. 5 times a day as needed for sore tongue or mouth. 5. Promethazine 25 mg p.o. q.4 to 6 hours p.r.n. nausea or vomiting. 6. Lansoprazole 15 mg p.o. p.r.n.  7. Ambien 10 mg p.o. at bedtime p.r.n. insomnia.  8. Hydrocodone liquid 1 teaspoon q.12 hours p.r.n.  9. Vitamin B12 1000 mcg p.o. daily. 10. Folic acid 0.4 mg daily. 11. Votrient 200 mg 4 tablets p.o. daily.   DISCHARGE CONDITION: Improved, stable.   DISCHARGE ACTIVITY: As tolerated.    DISCHARGE DIET: Regular.   DISCHARGE INSTRUCTIONS:  1. Take medications as prescribed. 2. Return to the Emergency Department for recurrence of symptoms or for any bleeding noted whatsoever.   FOLLOW-UP INSTRUCTIONS: Follow-up with Dr. Ma Hillock within one weeks. The patient needs repeat CBC within one week.    PROCEDURES: None.   PERTINENT LABORATORY DATA: CMP was normal on admission. CBC on admission WBC 2.3, hemoglobin 10.5, hematocrit 31.3, platelets 23, MCV 103. CBC on the day of discharge WBC 2, hemoglobin 9.7, hematocrit 29.1, platelets 47, MCV 104. Urinalysis turbid urine with 3+ blood, 741 RBCs, 40 WBCs, negative nitrite and leukocyte esterase, no bacteria, calcium oxalate crystals were present.    BRIEF HISTORY/HOSPITAL COURSE: The patient is a very pleasant 49 year old female with past medical history of stage IV PNET carcinoma originating in the lung with brain mets status post resection of brain mets, also history of malignant pleural effusion status post Pleurx catheter insertion, seizure disorder, gastroesophageal reflux disease, and anxiety who presented to the Emergency Department secondary to hematuria. Please see dictated admission history and physical for pertinent details surrounding the onset of this hospitalization. Please see below for further details.  1. Hematuria with profound thrombocytopenia for which the patient was transfused platelets and thereafter hematuria resolved and has not recurred. She was transfused platelets as per recommendations of her primary hematologist, Dr. Ma Hillock. She was seen in consultation by Dr. Ma Hillock. It was felt that she may have had an asymptomatic passage of a kidney stone. Urinalysis was reviewed. The patient presented with gross hematuria and was noted to have positive blood and RBCs in her urine in addition to calcium oxalate crystals. She did lose some blood with hematuria but not enough to require blood transfusion. She did well with platelet transfusion. Dr. Ma Hillock recommended sending the urine off for culture and this can be followed as an outpatient closely by  Dr. Ma Hillock. It was felt to be safe to have the patient discharged home per Dr. Ma Hillock who will follow-up her urine cultures as an outpatient and will call the patient at home with further  instructions if her urine culture is abnormal. In the meanwhile, the patient was instructed to return to the Emergency Department or to call Dr. Ma Hillock immediately if she notices any bleeding symptoms/complications or any acute illnesses or sickness. The patient verbalized her understanding of these instructions. The patient was advised to resume same dose of Votrient as before per Dr. Beverly Gust recommendations. Dr. Ma Hillock did not feel that Urology consultation was necessary at this time as the patient was asymptomatic and responded well to platelet transfusion.  2. In regards to her pancytopenia, she required platelet transfusion given her hematuria but did not require PRBC transfusion. She was non-neutropenic and afebrile and, therefore, no antibiotics we recommended or required at this time per Hematology/Oncology. Her platelet count has improved posttransfusion and there has been no recurrent bleeding noted or hematuria.  3. For her history of metastatic PNET cancer, she will follow-up with Dr. Ma Hillock as an outpatient. 4. For seizure disorder, the patient is to continue Keppra.  5. For gastroesophageal reflux disease, this is stable and the patient states she experiences infrequent reflux symptoms and only uses PPI on a p.r.n. basis. She was also advised to continue doing the same and also explained that PPI can also lead to and cause worsening of her thrombocytopenia.   On 06/21/2011 the patient was hemodynamically stable and without any hematuria or any bleeding noted and was felt to be stable for discharge home with close outpatient follow-up to which the patient was agreeable.   TIME SPENT WITH THE DISCHARGE: Greater than 30 minutes.   ____________________________ Romie Jumper, MD knl:drc D: 06/25/2011 21:32:42 ET T: 06/26/2011 11:46:57 ET JOB#: [id number]  cc: Romie Jumper, MD, <Dictator> Sandeep R. Ma Hillock, MD Romie Jumper MD ELECTRONICALLY SIGNED 06/30/2011 16:29
